# Patient Record
Sex: Male | Born: 2000 | Hispanic: No | Marital: Single | Smoking: Never smoker
Health system: Southern US, Community
[De-identification: ages and names within clinical notes are randomized; demographics above are authoritative.]

## PROBLEM LIST (undated history)

## (undated) DIAGNOSIS — G589 Mononeuropathy, unspecified: Secondary | ICD-10-CM

## (undated) DIAGNOSIS — I10 Essential (primary) hypertension: Secondary | ICD-10-CM

## (undated) DIAGNOSIS — Z8781 Personal history of (healed) traumatic fracture: Secondary | ICD-10-CM

## (undated) DIAGNOSIS — Z9889 Other specified postprocedural states: Secondary | ICD-10-CM

## (undated) HISTORY — DX: Essential (primary) hypertension: I10

---
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
## 2018-05-25 ENCOUNTER — Other Ambulatory Visit: Payer: Self-pay

## 2018-05-25 ENCOUNTER — Encounter (HOSPITAL_COMMUNITY): Payer: Self-pay

## 2018-05-25 ENCOUNTER — Emergency Department (HOSPITAL_COMMUNITY)
Admission: EM | Admit: 2018-05-25 | Discharge: 2018-05-25 | Disposition: A | Discharge status: Home / Self Care | Payer: Medicaid Other | Attending: Emergency Medicine | Admitting: Emergency Medicine

## 2018-05-25 DIAGNOSIS — G500 Trigeminal neuralgia: Secondary | ICD-10-CM | POA: Insufficient documentation

## 2018-05-25 DIAGNOSIS — G509 Disorder of trigeminal nerve, unspecified: Secondary | ICD-10-CM

## 2018-05-25 DIAGNOSIS — R200 Anesthesia of skin: Secondary | ICD-10-CM | POA: Diagnosis present

## 2018-05-25 MED ORDER — PREDNISONE 20 MG PO TABS: 40.0000 mg | ORAL_TABLET | Freq: Every day | ORAL | 0 refills | Status: DC

## 2018-05-25 NOTE — ED Notes (Signed)
ED Provider at bedside. 

## 2018-05-25 NOTE — ED Provider Notes (Signed)
[facility] DEPT Provider Note   CSN: [id number] Arrival date & time: 05/25/18  1530     History   Chief Complaint Chief Complaint  Patient presents with  . Numbness    HPI Glen Sullivan is a 17 y.o. male.  HPI   Glen Sullivan with R facial numbness. Intermittent over past 2 days. Feels around r eye, R cheek to about level of jawline. No weakness. No change in speech. No acute visual changes. No confusion. No rash. No pain.   History reviewed. No pertinent past medical history.  There are no active problems to display for this patient.   History reviewed. No pertinent surgical history.      Home Medications    Prior to Admission medications   Medication Sig Start Date End Date Taking? Authorizing Provider  predniSONE (DELTASONE) 20 MG tablet Take 2 tablets (40 mg total) by mouth daily. 05/25/18   Virgel Manifold, MD    Family History Family History  Problem Relation Age of Onset  . Diabetes Father   . Diabetes Other   . Hypertension Other     Social History Social History   Tobacco Use  . Smoking status: Never Smoker  Substance Use Topics  . Alcohol use: No  . Drug use: No     Allergies   Patient has no known allergies.   Review of Systems Review of Systems  All systems reviewed and negative, other than as noted in HPI.  Physical Exam Updated Vital Signs BP (!) 165/89 (BP Location: Left Arm)   Pulse 71   Temp 98.3 F (36.8 C) (Oral)   Resp 17   SpO2 100%   Physical Exam  Constitutional: He is oriented to person, place, and time. He appears well-developed and well-nourished. No distress.  HENT:  Head: Normocephalic and atraumatic.  Right Ear: External ear normal.  Left Ear: External ear normal.  Eyes: Pupils are equal, round, and reactive to light. Conjunctivae and EOM are normal. Right eye exhibits no discharge. Left eye exhibits no discharge.  Neck: Neck supple.  Cardiovascular: Normal rate, regular  rhythm and normal heart sounds. Exam reveals no gallop and no friction rub.  No murmur heard. Pulmonary/Chest: Effort normal and breath sounds normal. No respiratory distress.  Abdominal: Soft. He exhibits no distension. There is no tenderness.  Musculoskeletal: He exhibits no edema or tenderness.  Neurological: He is alert and oriented to person, place, and time.  Speech clear. Decreased sensation light tough r v2 distribution. CN, sensation otherwise intact. Strength 5/5 b/l u/l ext. Good finger to nose b/l. Steady gait.   Skin: Skin is warm and dry.  Psychiatric: He has a normal mood and affect. His behavior is normal. Thought content normal.  Nursing note and vitals reviewed.    ED Treatments / Results  Labs (all labs ordered are listed, but only abnormal results are displayed) Labs Reviewed - No data to display  EKG None  Radiology No results found.  Procedures Procedures (including critical care time)  Medications Ordered in ED Medications - No data to display   Initial Impression / Assessment and Plan / ED Course  I have reviewed the triage vital signs and the nursing notes.  Pertinent labs & imaging results that were available during my care of the patient were reviewed by me and considered in my medical decision making (see chart for details).      I have reviewed the triage vital signs and the nursing notes. Prior records were  reviewed for additional information.    Pertinent labs & imaging results that were available during my care of the patient were reviewed by me and considered in my medical decision making (see chart for details).   Glen Sullivan with intermittent numbness in R V2 distribution. No pain. No rash. No motor findings. He has no known systemic diseases. His neuro exam is non-focal aside from decreased sensation to light tough on R face. HTN noted, but he says he has been told this previously. Advised to establish with PCP and address this further. My  suspicion for CVA or space occupying lesion at this point is pretty low. If it was myself, I'd take a "wait and see" approach. If symptoms persist he may need imaging or other additional testing. I detailed return precautions with him and his mother. Outpt FU with neurology otherwise for persistent symptoms.   Final Clinical Impressions(s) / ED Diagnoses   Final diagnoses:  Trigeminal sensory loss    ED Discharge Orders         Ordered    predniSONE (DELTASONE) 20 MG tablet  Daily     05/25/18 1635           Virgel Manifold, MD 06/01/18 1108

## 2018-05-25 NOTE — Discharge Instructions (Addendum)
You are having numbness in a branch (V2) of a nerve called your trigeminal nerve. At this point, I suspect a benign cause. If this is something that persists beyond a few days, I want you to be evaluated by a neurologist. If you develop other symptoms such as weakness, changes in speech, vision problems, etc then I want you evaluated in the ER immediately.

## 2018-05-25 NOTE — ED Triage Notes (Signed)
Pt reports numbness bilaterally in both cheeks and chin. He states that it started last night, but improved and now it is back. He also reports some R sided otalgia last night that has improved, but worsens with cold. A&Ox4. Ambulatory. No facial droop or unilateral weakness.

## 2018-05-28 ENCOUNTER — Encounter (HOSPITAL_COMMUNITY): Payer: Self-pay

## 2018-05-28 ENCOUNTER — Other Ambulatory Visit: Payer: Self-pay

## 2018-05-28 DIAGNOSIS — R51 Headache: Secondary | ICD-10-CM | POA: Diagnosis present

## 2018-05-28 DIAGNOSIS — Z5321 Procedure and treatment not carried out due to patient leaving prior to being seen by health care provider: Secondary | ICD-10-CM | POA: Diagnosis not present

## 2018-05-28 NOTE — ED Triage Notes (Signed)
Pt presents to ED from home for HA and L arm numbness. Pt reports that he was seen here recently for facial numbness and told to return if symptoms worsened. Pt reports that the numbness and headache were worse earlier. Symptoms come and go.

## 2018-05-29 ENCOUNTER — Encounter (HOSPITAL_COMMUNITY): Payer: Self-pay

## 2018-05-29 ENCOUNTER — Other Ambulatory Visit: Payer: Self-pay

## 2018-05-29 ENCOUNTER — Emergency Department (HOSPITAL_COMMUNITY)
Admission: EM | Admit: 2018-05-29 | Discharge: 2018-05-29 | Disposition: A | Discharge status: Home / Self Care | Payer: Medicaid Other | Attending: Emergency Medicine | Admitting: Emergency Medicine

## 2018-05-29 ENCOUNTER — Emergency Department (HOSPITAL_COMMUNITY)
Admission: EM | Admit: 2018-05-29 | Discharge: 2018-05-29 | Disposition: A | Discharge status: Home / Self Care | Payer: Medicaid Other | Source: Home / Self Care | Attending: Emergency Medicine | Admitting: Emergency Medicine

## 2018-05-29 DIAGNOSIS — R202 Paresthesia of skin: Secondary | ICD-10-CM | POA: Insufficient documentation

## 2018-05-29 NOTE — ED Notes (Signed)
Patient called for rooming but did not answer.

## 2018-05-29 NOTE — ED Notes (Signed)
Patient did not come when called from lobby x2.

## 2018-05-29 NOTE — ED Notes (Signed)
Patient did not come when called from lobby x3. Triage RN aware.

## 2018-05-29 NOTE — ED Triage Notes (Signed)
Patient reports that he was seen 4 days ago for numbness of the face. patient states he was told to come back if any new numbness occurs. Patient states he woke last night with numbness of the left arm. Patient states the numbness has occurred in his left leg as well,but not now. Patient states the numbness is always worse at night.

## 2018-05-29 NOTE — ED Provider Notes (Signed)
[facility] DEPT Provider Note   CSN: [id number] Arrival date & time: 05/29/18  1121     History   Chief Complaint Chief Complaint  Patient presents with  . Numbness    HPI Glen Sullivan is a 17 y.o. male.  17yo male presents with complaints of numbness, described as altered sensation involving the left side forehead, left upper arm and left thigh currently. Patient was seen in the ER 5 days ago for same advised to follow up with neurology and is scheduled to be seen 06/12/18. Patient states for the past few days he experiences altered sensation mostly at night, sometimes involving both feet and hands, sometimes in the right side of his body, mostly on the left. States the feeling wakes him from his sleep, is able to move his arms and legs equally, denies a pins and needles type of feeling. Denies difficulty with speech, changes in vision, changes in gait or weakness, dizziness, headaches.      History reviewed. No pertinent past medical history.  There are no active problems to display for this patient.   History reviewed. No pertinent surgical history.      Home Medications    Prior to Admission medications   Medication Sig Start Date End Date Taking? Authorizing Provider  predniSONE (DELTASONE) 20 MG tablet Take 2 tablets (40 mg total) by mouth daily. 05/25/18  Yes Virgel Manifold, MD    Family History Family History  Problem Relation Age of Onset  . Diabetes Father   . Diabetes Other   . Hypertension Other     Social History Social History   Tobacco Use  . Smoking status: Never Smoker  . Smokeless tobacco: Never Used  Substance Use Topics  . Alcohol use: No  . Drug use: No     Allergies   Patient has no known allergies.   Review of Systems Review of Systems  Constitutional: Negative for activity change, appetite change, chills and fever.  Eyes: Negative for pain and visual disturbance.  Gastrointestinal:  Negative for nausea and vomiting.  Musculoskeletal: Negative for arthralgias, joint swelling and myalgias.  Skin: Negative for color change, rash and wound.  Allergic/Immunologic: Negative for immunocompromised state.  Neurological: Positive for numbness. Negative for dizziness, facial asymmetry, speech difficulty, weakness, light-headedness and headaches.  Hematological: Negative for adenopathy.  Psychiatric/Behavioral: Negative for confusion.  All other systems reviewed and are negative.    Physical Exam Updated Vital Signs BP (!) 152/80   Pulse 84   Temp 98.3 F (36.8 C)   Resp 16   Ht 5\' 9"  (1.753 m)   Wt 104.3 kg   SpO2 100%   BMI 33.97 kg/m   Physical Exam  Constitutional: He is oriented to person, place, and time. He appears well-developed and well-nourished. No distress.  HENT:  Head: Normocephalic and atraumatic.  Mouth/Throat: Oropharynx is clear and moist. No oropharyngeal exudate.  Eyes: Pupils are equal, round, and reactive to light. Conjunctivae and EOM are normal.  Neck: Normal range of motion. Neck supple.  Cardiovascular: Normal rate, regular rhythm, normal heart sounds and intact distal pulses.  No murmur heard. Pulmonary/Chest: Effort normal and breath sounds normal. No respiratory distress.  Musculoskeletal: He exhibits no tenderness or deformity.  Neurological: He is alert and oriented to person, place, and time. He has normal strength and normal reflexes. He is not disoriented. He displays normal reflexes. A sensory deficit is present. No cranial nerve deficit. He exhibits normal muscle tone. He displays a  negative Romberg sign. Coordination and gait normal. GCS eye subscore is 4. GCS verbal subscore is 5. GCS motor subscore is 6.  Negative pronator drift, normal heel/shin and finger to nose. Reports altered sensation to left forehead, left upper arm laterally.  Skin: Skin is warm and dry. He is not diaphoretic.  Psychiatric: He has a normal mood and  affect. His behavior is normal.  Nursing note and vitals reviewed.    ED Treatments / Results  Labs (all labs ordered are listed, but only abnormal results are displayed) Labs Reviewed - No data to display  EKG None  Radiology No results found.  Procedures Procedures (including critical care time)  Medications Ordered in ED Medications - No data to display   Initial Impression / Assessment and Plan / ED Course  I have reviewed the triage vital signs and the nursing notes.  Pertinent labs & imaging results that were available during my care of the patient were reviewed by me and considered in my medical decision making (see chart for details).  Clinical Course as of May 30 1339  Thu May 29, 2018  1337 17yo male returns to ER with ongoing complaint of altered sensation. Patient was seen in the ER 5 days ago for altered sensation to the face, states now has symptoms in the legs and arms. Symptoms are worse at night, occasionally effecting the right arm and leg, more often effecting the left arm and leg. Denies any changes in speech, gait, vision. Exam today is unremarkable with the the exception of altered light touch sensation to the anterior left upper arm and left forehead. Patient is scheduled to see neurology 06/12/18, advised to keep this appointment, return to ER for worsening or concerning symptoms. History is not consistent with CVA or other emergent condition.   [LM]    Clinical Course User Index [LM] Tacy Learn, PA-C    Final Clinical Impressions(s) / ED Diagnoses   Final diagnoses:  Paresthesia    ED Discharge Orders    None       Roque Lias 05/29/18 1340    Lacretia Leigh, MD 05/30/18 1710

## 2018-05-29 NOTE — Discharge Instructions (Addendum)
Return to ER for worsening or concerning symptoms, otherwise follow up with neurology as scheduled.

## 2018-06-12 ENCOUNTER — Encounter: Payer: Self-pay | Admitting: Neurology

## 2018-06-12 ENCOUNTER — Telehealth: Payer: Self-pay | Admitting: Neurology

## 2018-06-12 ENCOUNTER — Ambulatory Visit: Payer: Medicaid Other | Admitting: Neurology

## 2018-06-12 VITALS — BP 132/78 | HR 73 | Ht 70.0 in | Wt 234.0 lb

## 2018-06-12 DIAGNOSIS — R202 Paresthesia of skin: Secondary | ICD-10-CM | POA: Diagnosis not present

## 2018-06-12 NOTE — Telephone Encounter (Signed)
medicaid order sent to [facility]. They will obtain the auth and will reach out to the pt to schedule.

## 2018-06-12 NOTE — Patient Instructions (Signed)
I had a long discussion with the patient regarding his facial and left arm paresthesias discussed differential diagnosis, evaluation plan and answered questions. I recommend that we  check MRI scan of the brain with and without contrast and further evaluation will depend on the scan results. Since the paresthesias have resolved at the present time does not need specific medications but have advised him to call me if they recur. He will return for follow-up in the future only as needed.  Paresthesia Paresthesia is a burning or prickling feeling. This feeling can happen in any part of the body. It often happens in the hands, arms, legs, or feet. Usually, it is not painful. In most cases, the feeling goes away in a short time and is not a sign of a serious problem. Follow these instructions at home:  Avoid drinking alcohol.  Try massage or needle therapy (acupuncture) to help with your problems.  Keep all follow-up visits as told by your doctor. This is important. Contact a doctor if:  You keep on having episodes of paresthesia.  Your burning or prickling feeling gets worse when you walk.  You have pain or cramps.  You feel dizzy.  You have a rash. Get help right away if:  You feel weak.  You have trouble walking or moving.  You have problems speaking, understanding, or seeing.  You feel confused.  You cannot control when you pee (urinate) or poop (bowel movement).  You lose feeling (numbness) after an injury.  You pass out (faint). This information is not intended to replace advice given to you by your health care provider. Make sure you discuss any questions you have with your health care provider. Document Released: 08/30/2008 Document Revised: 02/23/2016 Document Reviewed: 09/13/2014 Elsevier Interactive Patient Education  [employer].

## 2018-06-12 NOTE — Progress Notes (Signed)
[facility] [address] [place]. [place] [address] [phone]       OFFICE CONSULT NOTE  [name] Date of Birth:  2000-10-06 Medical Record Number:  [id number]   Referring MD:  Suella Broad, PA-c Reason for Referral:  numbness HPI:  17 year old Hispanic male seen today for initial office consultation visit follow-up numbness. History is obtained from the patient, review of electronic medical records. Patient states that about 2 weeks ago he woke up in sleep with numbness on his face. He describes this as the face feeling different compared to the rest of the body. He denies any tingling burning or pain sensation. He had no headache at that time. He went back to sleep. When he woke up the next day the numbness on the face was back. As the day went on he also noticed some numbness in the left arm. This lasted most of the day. The day after that he noticed that the numbness gradually went away. He states he did have a mild headache at that time but that it was not severe or incapacitating. Denied any accompanying nausea, blurred vision or visual symptoms. He does not have any prior history of migraine headaches, seizures, significant neurological problems. He denies any other episodes of blurred vision, loss of vision, vertigo, diplopia, gait or balance problems. Patient was seen in the ER and given prednisone taper pack which she has finished. Denies any history of allergies with recurrent sinus infections. Denies drinking drugs and smoking cigarettes or drinking alcohol.there is no history of strokes or heart attacks at a young age. There is no history of deep and thrombosis pulmonary embolism.  ROS:   14 system review of systems is positive for numbness only all other systems negative  PMH: History reviewed. No pertinent past medical history.  Social History:  Social History   Socioeconomic History  . Marital status: Single    Spouse name: Not on file    . Number of children: Not on file  . Years of education: Not on file  . Highest education level: Not on file  Occupational History  . Not on file  Social Needs  . Financial resource strain: Not on file  . Food insecurity:    Worry: Not on file    Inability: Not on file  . Transportation needs:    Medical: Not on file    Non-medical: Not on file  Tobacco Use  . Smoking status: Never Smoker  . Smokeless tobacco: Never Used  Substance and Sexual Activity  . Alcohol use: No  . Drug use: No  . Sexual activity: Not on file  Lifestyle  . Physical activity:    Days per week: Not on file    Minutes per session: Not on file  . Stress: Not on file  Relationships  . Social connections:    Talks on phone: Not on file    Gets together: Not on file    Attends religious service: Not on file    Active member of club or organization: Not on file    Attends meetings of clubs or organizations: Not on file    Relationship status: Not on file  . Intimate partner violence:    Fear of current or ex partner: Not on file    Emotionally abused: Not on file    Physically abused: Not on file    Forced sexual activity: Not on file  Other Topics Concern  . Not on file  Social History  Narrative  . Not on file    Medications:   No current outpatient medications on file prior to visit.   No current facility-administered medications on file prior to visit.     Allergies:  No Known Allergies  Physical Exam General: well developed, well nourished, young Hispanic male seated, in no evident distress Head: head normocephalic and atraumatic.   Neck: supple with no carotid or supraclavicular bruits Cardiovascular: regular rate and rhythm, no murmurs Musculoskeletal: no deformity Skin:  no rash/petichiae Vascular:  Normal pulses all extremities  Neurologic Exam Mental Status: Awake and fully alert. Oriented to place and time. Recent and remote memory intact. Attention span, concentration and  fund of knowledge appropriate. Mood and affect appropriate.  Cranial Nerves: Fundoscopic exam reveals sharp disc margins. Pupils equal, briskly reactive to light. Extraocular movements full without nystagmus. Visual fields full to confrontation. Hearing intact. Facial sensation intact. Face, tongue, palate moves normally and symmetrically.  Motor: Normal bulk and tone. Normal strength in all tested extremity muscles. Sensory.: intact to touch , pinprick , position and vibratory sensation.  Coordination: Rapid alternating movements normal in all extremities. Finger-to-nose and heel-to-shin performed accurately bilaterally. Gait and Station: Arises from chair without difficulty. Stance is normal. Gait demonstrates normal stride length and balance . Able to heel, toe and tandem walk without difficulty.  Reflexes: 1+ and symmetric. Toes downgoing.       ASSESSMENT: 17 year old Hispanic male with transient face and left arm paresthesias of unclear etiology which appear to have resolved.     PLAN: I had a long discussion with the patient regarding his facial and left arm paresthesias discussed differential diagnosis, evaluation plan and answered questions. I recommend that we  check MRI scan of the brain with and without contrast and further evaluation will depend on the scan results. Since the paresthesias have resolved at the present time does not need specific medications but have advised him to call me if they recur. Greater than 50% time during this 45 mine consultation visit was spent on counseling and coordination of care about his face and arm paresthesias and answering questions.He will return for follow-up in the future only as needed.  Antony Contras, MD  [facility] [address] [place], [place] [address]  Phone [phone] Fax [phone] Note: This document was prepared with digital dictation and possible smart phrase technology. Any transcriptional  errors that result from this process are unintentional.

## 2018-06-26 ENCOUNTER — Other Ambulatory Visit: Payer: Self-pay | Admitting: Neurology

## 2018-06-26 ENCOUNTER — Ambulatory Visit
Admission: RE | Admit: 2018-06-26 | Discharge: 2018-06-26 | Disposition: A | Discharge status: Home / Self Care | Payer: Medicaid Other | Source: Ambulatory Visit | Attending: Neurology | Admitting: Neurology

## 2018-06-26 DIAGNOSIS — Z77018 Contact with and (suspected) exposure to other hazardous metals: Secondary | ICD-10-CM

## 2018-06-26 DIAGNOSIS — R202 Paresthesia of skin: Secondary | ICD-10-CM

## 2018-06-26 MED ORDER — GADOBENATE DIMEGLUMINE 529 MG/ML IV SOLN
20.0000 mL | Freq: Once | INTRAVENOUS | Status: AC | PRN
  Administered 2018-06-26: 20 mL via INTRAVENOUS

## 2018-06-27 ENCOUNTER — Telehealth: Payer: Self-pay | Admitting: *Deleted

## 2018-06-27 NOTE — Telephone Encounter (Signed)
Spoke with patient and informed him that his brain MRI scan was normal. He had no questions, verbalized understanding, appreciation.

## 2019-07-03 IMAGING — CR DG ORBITS FOR FOREIGN BODY
2 series · 2 of 2 positions shown · non-contrast
Comparison: None.

CLINICAL DATA: Metal working/exposure; clearance prior to MRI

EXAM:
ORBITS FOR FOREIGN BODY - 2 VIEW

[w orbit pa (1 of 2)]
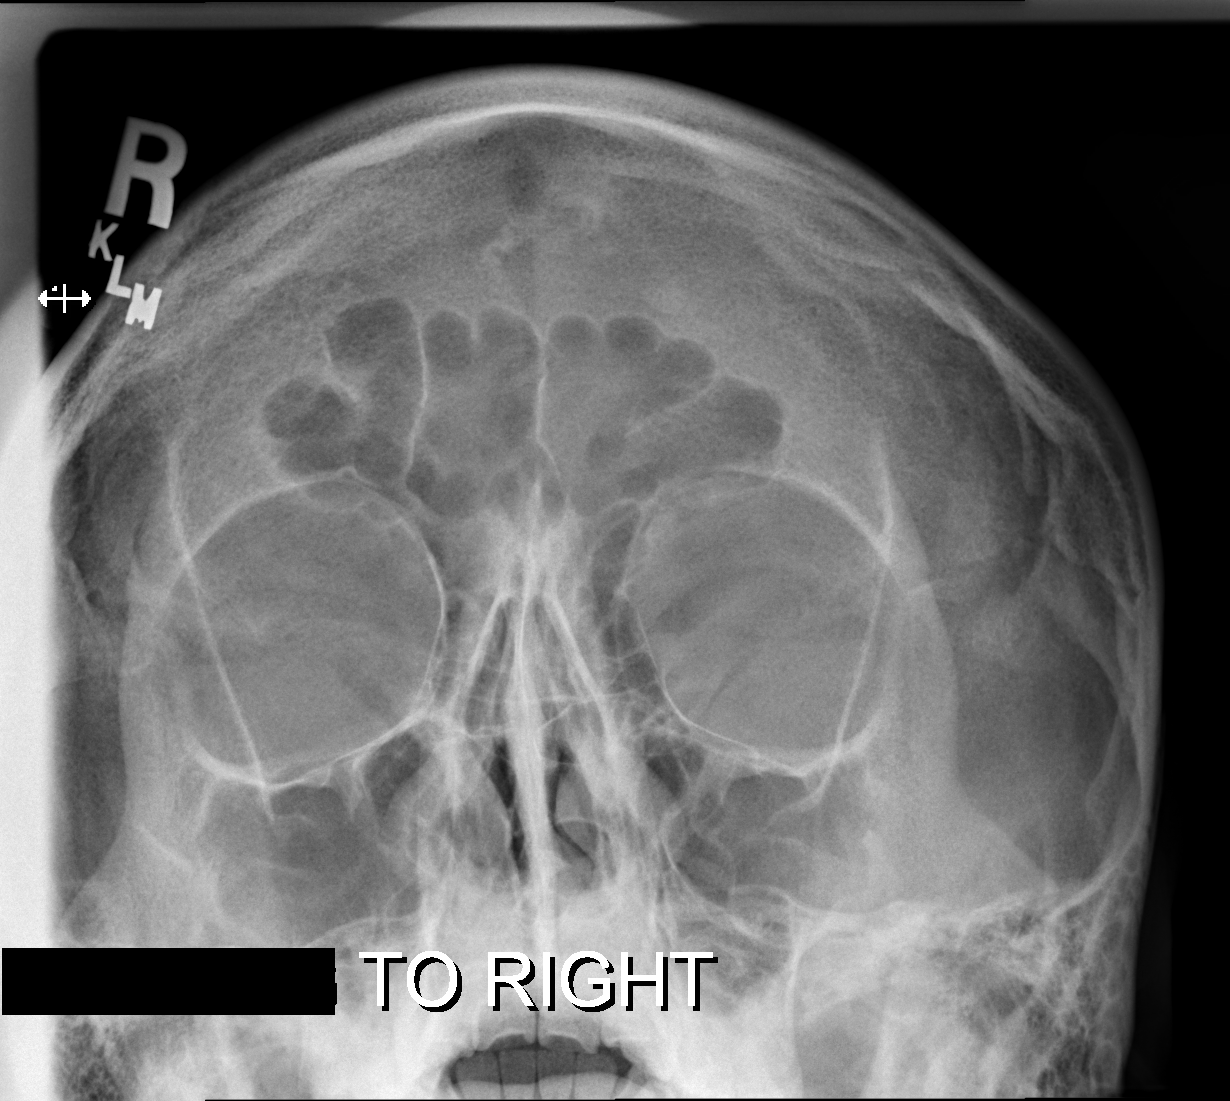

[w orbit pa (2 of 2)]
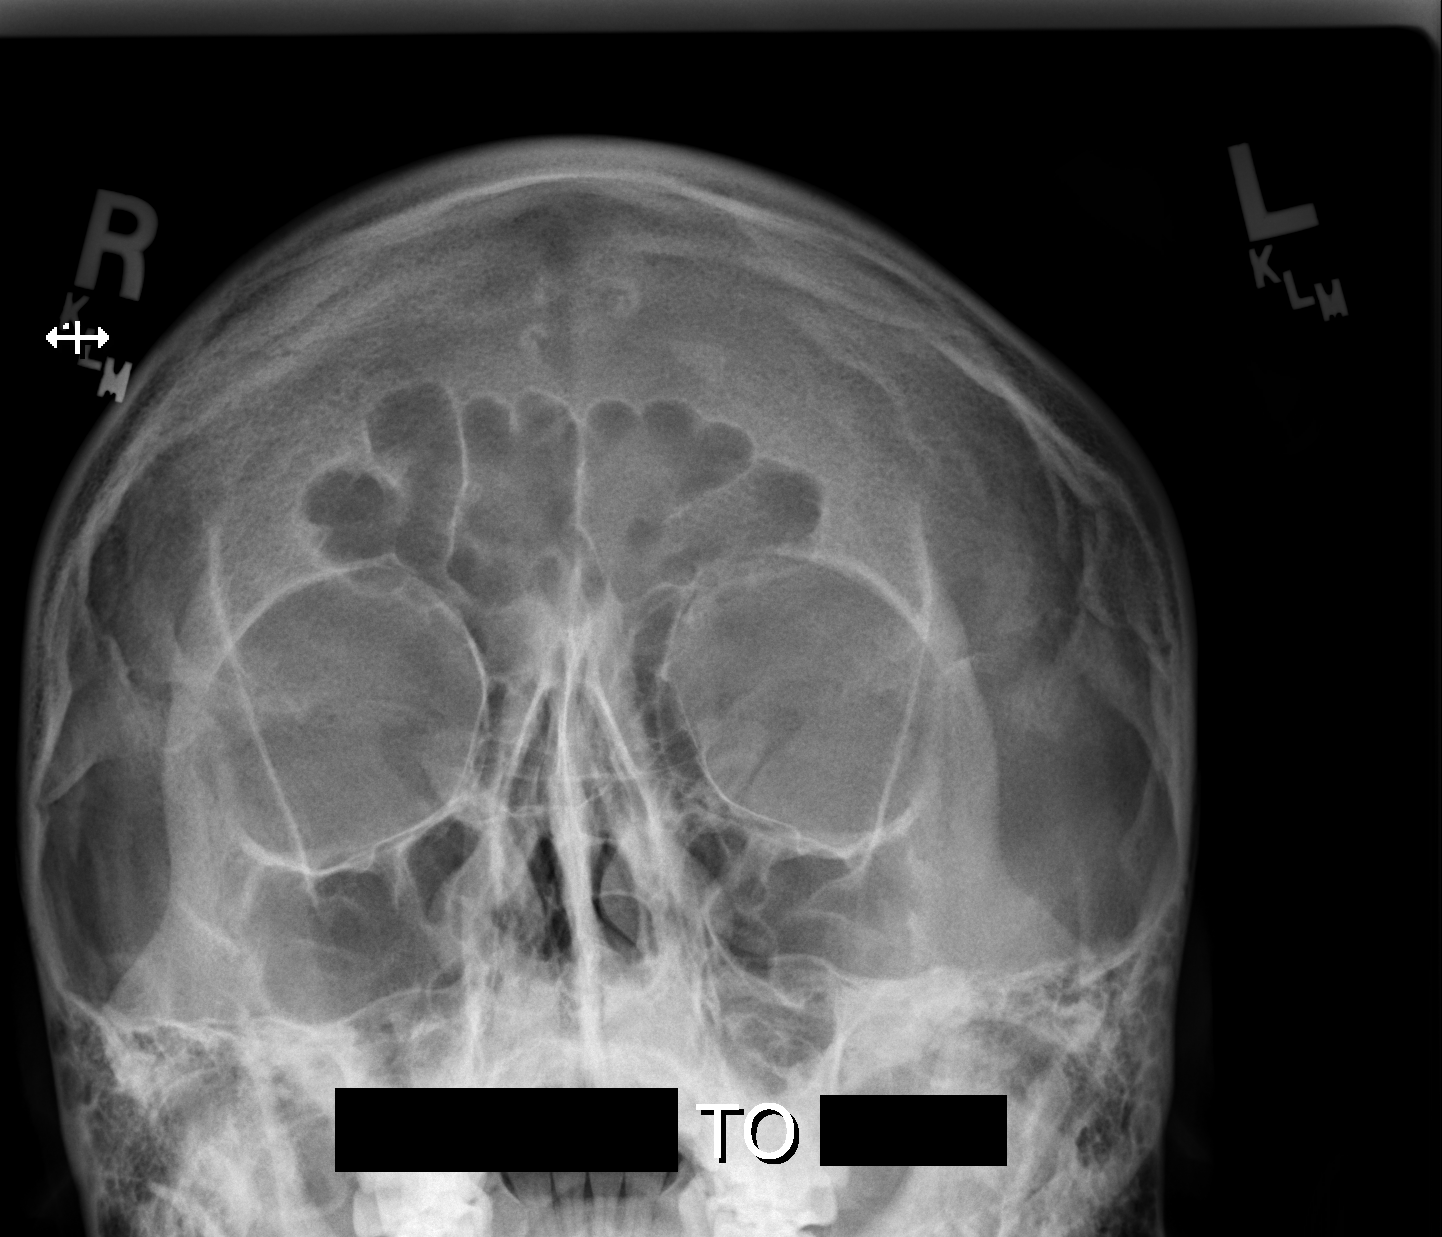

[2 of 2 positions shown; findings below may reference images not displayed]

FINDINGS: There is no evidence of metallic foreign body within the orbits. No
significant bone abnormality identified.
IMPRESSION: No evidence of metallic foreign body within the orbits.

## 2022-06-25 ENCOUNTER — Observation Stay (HOSPITAL_COMMUNITY)
Admission: EM | Admit: 2022-06-25 | Discharge: 2022-06-26 | Disposition: A | Discharge status: Home / Self Care | Payer: BLUE CROSS/BLUE SHIELD | Attending: Emergency Medicine | Admitting: Emergency Medicine

## 2022-06-25 ENCOUNTER — Encounter (HOSPITAL_COMMUNITY): Payer: Self-pay | Admitting: Emergency Medicine

## 2022-06-25 ENCOUNTER — Emergency Department (HOSPITAL_COMMUNITY): Payer: BLUE CROSS/BLUE SHIELD | Admitting: Anesthesiology

## 2022-06-25 ENCOUNTER — Emergency Department (HOSPITAL_COMMUNITY): Payer: BLUE CROSS/BLUE SHIELD

## 2022-06-25 ENCOUNTER — Encounter (HOSPITAL_COMMUNITY)
Admission: EM | Disposition: A | Discharge status: Home / Self Care | Payer: Self-pay | Source: Home / Self Care | Attending: Emergency Medicine

## 2022-06-25 DIAGNOSIS — Z9049 Acquired absence of other specified parts of digestive tract: Secondary | ICD-10-CM

## 2022-06-25 DIAGNOSIS — K3532 Acute appendicitis with perforation and localized peritonitis, without abscess: Secondary | ICD-10-CM | POA: Diagnosis not present

## 2022-06-25 DIAGNOSIS — R1031 Right lower quadrant pain: Secondary | ICD-10-CM | POA: Diagnosis present

## 2022-06-25 DIAGNOSIS — K3580 Unspecified acute appendicitis: Principal | ICD-10-CM

## 2022-06-25 HISTORY — DX: Mononeuropathy, unspecified: G58.9

## 2022-06-25 HISTORY — DX: Personal history of (healed) traumatic fracture: Z87.81

## 2022-06-25 HISTORY — PX: LAPAROSCOPIC APPENDECTOMY: SHX408

## 2022-06-25 HISTORY — DX: Other specified postprocedural states: Z98.890

## 2022-06-25 LAB — COMPREHENSIVE METABOLIC PANEL
ALT: 14 U/L (ref 0–44)
AST: 15 U/L (ref 15–41)
Albumin: 4.8 g/dL (ref 3.5–5.0)
Alkaline Phosphatase: 61 U/L (ref 38–126)
Anion gap: 10 (ref 5–15)
BUN: 10 mg/dL (ref 6–20)
CO2: 22 mmol/L (ref 22–32)
Calcium: 9.6 mg/dL (ref 8.9–10.3)
Chloride: 103 mmol/L (ref 98–111)
Creatinine, Ser: 0.87 mg/dL (ref 0.61–1.24)
GFR, Estimated: >60 mL/min (ref >60)
Glucose, Bld: 109 mg/dL — ABNORMAL HIGH (ref 70–99)
Potassium: 3.6 mmol/L (ref 3.5–5.1)
Sodium: 135 mmol/L (ref 135–145)
Total Bilirubin: 1 mg/dL (ref 0.3–1.2)
Total Protein: 7.9 g/dL (ref 6.5–8.1)

## 2022-06-25 LAB — CBC
HCT: 42.4 % (ref 39.0–52.0)
Hemoglobin: 14.9 g/dL (ref 13.0–17.0)
MCH: 30.8 pg (ref 26.0–34.0)
MCHC: 35.1 g/dL (ref 30.0–36.0)
MCV: 87.6 fL (ref 80.0–100.0)
Platelets: 196 10*3/uL (ref 150–400)
RBC: 4.84 MIL/uL (ref 4.22–5.81)
RDW: 12.1 % (ref 11.5–15.5)
WBC: 12.6 10*3/uL — ABNORMAL HIGH (ref 4.0–10.5)
nRBC: 0 % (ref 0.0–0.2)

## 2022-06-25 LAB — URINALYSIS, ROUTINE W REFLEX MICROSCOPIC
Bacteria, UA: NONE SEEN
Bilirubin Urine: NEGATIVE
Glucose, UA: NEGATIVE mg/dL
Hgb urine dipstick: NEGATIVE
Ketones, ur: 20 mg/dL — AB
Leukocytes,Ua: NEGATIVE
Nitrite: NEGATIVE
Protein, ur: 100 mg/dL — AB
Specific Gravity, Urine: 1.01 (ref 1.005–1.030)
pH: 7 (ref 5.0–8.0)

## 2022-06-25 LAB — LIPASE, BLOOD: Lipase: 28 U/L (ref 11–51)

## 2022-06-25 SURGERY — APPENDECTOMY, LAPAROSCOPIC
Anesthesia: General

## 2022-06-25 MED ORDER — SENNA 8.6 MG PO TABS
1.0000 | ORAL_TABLET | Freq: Two times a day (BID) | ORAL | Status: DC
  Administered 2022-06-25 – 2022-06-26 (×2): 8.6 mg via ORAL
  Filled 2022-06-25 (×2): qty 1

## 2022-06-25 MED ORDER — DEXAMETHASONE SODIUM PHOSPHATE 10 MG/ML IJ SOLN
INTRAMUSCULAR | Status: AC
  Filled 2022-06-25: qty 1

## 2022-06-25 MED ORDER — CHLORHEXIDINE GLUCONATE 0.12 % MT SOLN
15.0000 mL | Freq: Once | OROMUCOSAL | Status: AC
  Administered 2022-06-25: 15 mL via OROMUCOSAL

## 2022-06-25 MED ORDER — DIPHENHYDRAMINE HCL 12.5 MG/5ML PO ELIX: 12.5000 mg | ORAL_SOLUTION | Freq: Four times a day (QID) | ORAL | Status: DC | PRN

## 2022-06-25 MED ORDER — ROCURONIUM BROMIDE 10 MG/ML (PF) SYRINGE
PREFILLED_SYRINGE | INTRAVENOUS | Status: AC
  Filled 2022-06-25: qty 10

## 2022-06-25 MED ORDER — HYDROMORPHONE HCL 1 MG/ML IJ SOLN
0.2500 mg | INTRAMUSCULAR | Status: DC | PRN
  Administered 2022-06-25: 0.5 mg via INTRAVENOUS

## 2022-06-25 MED ORDER — OXYCODONE HCL 5 MG PO TABS
ORAL_TABLET | ORAL | Status: AC
  Filled 2022-06-25: qty 1

## 2022-06-25 MED ORDER — MELATONIN 3 MG PO TABS: 3.0000 mg | ORAL_TABLET | Freq: Every evening | ORAL | Status: DC | PRN

## 2022-06-25 MED ORDER — CEFTRIAXONE SODIUM 2 G IJ SOLR
2.0000 g | Freq: Once | INTRAMUSCULAR | Status: AC
  Administered 2022-06-25: 2 g via INTRAVENOUS
  Filled 2022-06-25: qty 20

## 2022-06-25 MED ORDER — HYDROMORPHONE HCL 1 MG/ML IJ SOLN
INTRAMUSCULAR | Status: AC
  Filled 2022-06-25: qty 1

## 2022-06-25 MED ORDER — IOHEXOL 300 MG/ML  SOLN
100.0000 mL | Freq: Once | INTRAMUSCULAR | Status: AC | PRN
  Administered 2022-06-25: 100 mL via INTRAVENOUS

## 2022-06-25 MED ORDER — ROCURONIUM BROMIDE 10 MG/ML (PF) SYRINGE
PREFILLED_SYRINGE | INTRAVENOUS | Status: DC | PRN
  Administered 2022-06-25: 50 mg via INTRAVENOUS
  Administered 2022-06-25: 10 mg via INTRAVENOUS

## 2022-06-25 MED ORDER — KETOROLAC TROMETHAMINE 30 MG/ML IJ SOLN: 30.0000 mg | Freq: Four times a day (QID) | INTRAMUSCULAR | Status: DC | PRN

## 2022-06-25 MED ORDER — KCL IN DEXTROSE-NACL 20-5-0.45 MEQ/L-%-% IV SOLN
INTRAVENOUS | Status: AC
  Filled 2022-06-25: qty 1000

## 2022-06-25 MED ORDER — SUGAMMADEX SODIUM 200 MG/2ML IV SOLN
INTRAVENOUS | Status: DC | PRN
  Administered 2022-06-25: 200 mg via INTRAVENOUS

## 2022-06-25 MED ORDER — PROPOFOL 10 MG/ML IV BOLUS
INTRAVENOUS | Status: DC | PRN
  Administered 2022-06-25: 200 mg via INTRAVENOUS

## 2022-06-25 MED ORDER — PROMETHAZINE HCL 25 MG/ML IJ SOLN: 6.2500 mg | INTRAMUSCULAR | Status: DC | PRN

## 2022-06-25 MED ORDER — ONDANSETRON 4 MG PO TBDP: 4.0000 mg | ORAL_TABLET | Freq: Four times a day (QID) | ORAL | Status: DC | PRN

## 2022-06-25 MED ORDER — MIDAZOLAM HCL 2 MG/2ML IJ SOLN
INTRAMUSCULAR | Status: AC
  Filled 2022-06-25: qty 2

## 2022-06-25 MED ORDER — ACETAMINOPHEN 650 MG RE SUPP: 650.0000 mg | Freq: Four times a day (QID) | RECTAL | Status: DC | PRN

## 2022-06-25 MED ORDER — OXYCODONE HCL 5 MG PO TABS: 5.0000 mg | ORAL_TABLET | Freq: Once | ORAL | Status: DC | PRN

## 2022-06-25 MED ORDER — OXYCODONE HCL 5 MG PO TABS: 5.0000 mg | ORAL_TABLET | ORAL | Status: DC | PRN

## 2022-06-25 MED ORDER — SODIUM CHLORIDE 0.9 % IV SOLN: 2.0000 g | INTRAVENOUS | Status: DC

## 2022-06-25 MED ORDER — SODIUM CHLORIDE (PF) 0.9 % IJ SOLN
INTRAMUSCULAR | Status: AC
  Filled 2022-06-25: qty 50

## 2022-06-25 MED ORDER — LIDOCAINE 2% (20 MG/ML) 5 ML SYRINGE
INTRAMUSCULAR | Status: DC | PRN
  Administered 2022-06-25: 80 mg via INTRAVENOUS

## 2022-06-25 MED ORDER — PROCHLORPERAZINE EDISYLATE 10 MG/2ML IJ SOLN: 5.0000 mg | Freq: Four times a day (QID) | INTRAMUSCULAR | Status: DC | PRN

## 2022-06-25 MED ORDER — KETOROLAC TROMETHAMINE 30 MG/ML IJ SOLN
INTRAMUSCULAR | Status: AC
  Filled 2022-06-25: qty 1

## 2022-06-25 MED ORDER — PROPOFOL 10 MG/ML IV BOLUS
INTRAVENOUS | Status: AC
  Filled 2022-06-25: qty 20

## 2022-06-25 MED ORDER — LIDOCAINE HCL 1 % IJ SOLN
INTRAMUSCULAR | Status: AC
  Filled 2022-06-25: qty 20

## 2022-06-25 MED ORDER — PROCHLORPERAZINE MALEATE 10 MG PO TABS: 10.0000 mg | ORAL_TABLET | Freq: Four times a day (QID) | ORAL | Status: DC | PRN

## 2022-06-25 MED ORDER — METRONIDAZOLE 500 MG/100ML IV SOLN
500.0000 mg | Freq: Once | INTRAVENOUS | Status: AC
  Administered 2022-06-25: 500 mg via INTRAVENOUS
  Filled 2022-06-25: qty 100

## 2022-06-25 MED ORDER — SUCCINYLCHOLINE CHLORIDE 200 MG/10ML IV SOSY
PREFILLED_SYRINGE | INTRAVENOUS | Status: DC | PRN
  Administered 2022-06-25: 140 mg via INTRAVENOUS

## 2022-06-25 MED ORDER — DEXAMETHASONE SODIUM PHOSPHATE 10 MG/ML IJ SOLN
INTRAMUSCULAR | Status: DC | PRN
  Administered 2022-06-25: 10 mg via INTRAVENOUS

## 2022-06-25 MED ORDER — FENTANYL CITRATE (PF) 100 MCG/2ML IJ SOLN
INTRAMUSCULAR | Status: DC | PRN
  Administered 2022-06-25: 100 ug via INTRAVENOUS

## 2022-06-25 MED ORDER — KETOROLAC TROMETHAMINE 30 MG/ML IJ SOLN
30.0000 mg | Freq: Four times a day (QID) | INTRAMUSCULAR | Status: AC
  Administered 2022-06-25 – 2022-06-26 (×3): 30 mg via INTRAVENOUS
  Filled 2022-06-25 (×2): qty 1

## 2022-06-25 MED ORDER — METRONIDAZOLE 500 MG/100ML IV SOLN
500.0000 mg | Freq: Two times a day (BID) | INTRAVENOUS | Status: DC
  Administered 2022-06-26: 500 mg via INTRAVENOUS
  Filled 2022-06-25: qty 100

## 2022-06-25 MED ORDER — MEPERIDINE HCL 50 MG/ML IJ SOLN: 6.2500 mg | INTRAMUSCULAR | Status: DC | PRN

## 2022-06-25 MED ORDER — BUPIVACAINE-EPINEPHRINE (PF) 0.25% -1:200000 IJ SOLN
INTRAMUSCULAR | Status: AC
  Filled 2022-06-25: qty 30

## 2022-06-25 MED ORDER — FENTANYL CITRATE (PF) 100 MCG/2ML IJ SOLN
INTRAMUSCULAR | Status: AC
  Filled 2022-06-25: qty 2

## 2022-06-25 MED ORDER — ONDANSETRON HCL 4 MG/2ML IJ SOLN
INTRAMUSCULAR | Status: AC
  Filled 2022-06-25: qty 2

## 2022-06-25 MED ORDER — ACETAMINOPHEN 325 MG PO TABS: 650.0000 mg | ORAL_TABLET | Freq: Four times a day (QID) | ORAL | Status: DC | PRN

## 2022-06-25 MED ORDER — LACTATED RINGERS IV SOLN: INTRAVENOUS | Status: DC

## 2022-06-25 MED ORDER — LACTATED RINGERS IR SOLN
Status: DC | PRN
  Administered 2022-06-25: 1

## 2022-06-25 MED ORDER — LIDOCAINE HCL (PF) 1 % IJ SOLN
INTRAMUSCULAR | Status: DC | PRN
  Administered 2022-06-25: 10 mL

## 2022-06-25 MED ORDER — OXYCODONE HCL 5 MG/5ML PO SOLN: 5.0000 mg | Freq: Once | ORAL | Status: DC | PRN

## 2022-06-25 MED ORDER — 0.9 % SODIUM CHLORIDE (POUR BTL) OPTIME
TOPICAL | Status: DC | PRN
  Administered 2022-06-25: 500 mL

## 2022-06-25 MED ORDER — DIPHENHYDRAMINE HCL 50 MG/ML IJ SOLN: 12.5000 mg | Freq: Four times a day (QID) | INTRAMUSCULAR | Status: DC | PRN

## 2022-06-25 MED ORDER — PHENYLEPHRINE 80 MCG/ML (10ML) SYRINGE FOR IV PUSH (FOR BLOOD PRESSURE SUPPORT)
PREFILLED_SYRINGE | INTRAVENOUS | Status: DC | PRN
  Administered 2022-06-25: 80 ug via INTRAVENOUS

## 2022-06-25 MED ORDER — SUCCINYLCHOLINE CHLORIDE 200 MG/10ML IV SOSY
PREFILLED_SYRINGE | INTRAVENOUS | Status: AC
  Filled 2022-06-25: qty 10

## 2022-06-25 MED ORDER — TRAMADOL HCL 50 MG PO TABS
50.0000 mg | ORAL_TABLET | Freq: Four times a day (QID) | ORAL | Status: DC | PRN
  Administered 2022-06-25 – 2022-06-26 (×2): 50 mg via ORAL
  Filled 2022-06-25 (×2): qty 1

## 2022-06-25 MED ORDER — ONDANSETRON HCL 4 MG/2ML IJ SOLN
INTRAMUSCULAR | Status: DC | PRN
  Administered 2022-06-25: 4 mg via INTRAVENOUS

## 2022-06-25 MED ORDER — MIDAZOLAM HCL 5 MG/5ML IJ SOLN
INTRAMUSCULAR | Status: DC | PRN
  Administered 2022-06-25: 2 mg via INTRAVENOUS

## 2022-06-25 MED ORDER — METHOCARBAMOL 500 MG PO TABS: 500.0000 mg | ORAL_TABLET | Freq: Four times a day (QID) | ORAL | Status: DC | PRN

## 2022-06-25 MED ORDER — BUPIVACAINE-EPINEPHRINE 0.25% -1:200000 IJ SOLN
INTRAMUSCULAR | Status: DC | PRN
  Administered 2022-06-25: 10 mL

## 2022-06-25 MED ORDER — ONDANSETRON HCL 4 MG/2ML IJ SOLN: 4.0000 mg | Freq: Four times a day (QID) | INTRAMUSCULAR | Status: DC | PRN

## 2022-06-25 MED ORDER — MORPHINE SULFATE (PF) 2 MG/ML IV SOLN: 1.0000 mg | INTRAVENOUS | Status: DC | PRN

## 2022-06-25 MED ORDER — ENOXAPARIN SODIUM 40 MG/0.4ML IJ SOSY
40.0000 mg | PREFILLED_SYRINGE | INTRAMUSCULAR | Status: DC
  Administered 2022-06-26: 40 mg via SUBCUTANEOUS
  Filled 2022-06-25: qty 0.4

## 2022-06-25 MED ORDER — AMISULPRIDE (ANTIEMETIC) 5 MG/2ML IV SOLN: 10.0000 mg | Freq: Once | INTRAVENOUS | Status: DC | PRN

## 2022-06-25 SURGICAL SUPPLY — 37 items
APPLIER CLIP ROT 10 11.4 M/L (STAPLE)
BAG COUNTER SPONGE SURGICOUNT (BAG) IMPLANT
CABLE HIGH FREQUENCY MONO STRZ (ELECTRODE) ×1 IMPLANT
CLIP APPLIE ROT 10 11.4 M/L (STAPLE) IMPLANT
COVER SURGICAL LIGHT HANDLE (MISCELLANEOUS) ×1 IMPLANT
CUTTER FLEX LINEAR 45M (STAPLE) IMPLANT
DERMABOND ADVANCED .7 DNX12 (GAUZE/BANDAGES/DRESSINGS) ×1 IMPLANT
DRAPE LAPAROSCOPIC ABDOMINAL (DRAPES) ×1 IMPLANT
ELECT REM PT RETURN 15FT ADLT (MISCELLANEOUS) ×1 IMPLANT
ENDOLOOP SUT PDS II  0 18 (SUTURE)
ENDOLOOP SUT PDS II 0 18 (SUTURE) IMPLANT
GLOVE BIO SURGEON STRL SZ 6 (GLOVE) ×1 IMPLANT
GLOVE INDICATOR 6.5 STRL GRN (GLOVE) ×1 IMPLANT
GOWN SPEC L3 XXLG W/TWL (GOWN DISPOSABLE) ×1 IMPLANT
GOWN STRL REUS W/TWL 2XL LVL3 (GOWN DISPOSABLE) ×1 IMPLANT
IRRIG SUCT STRYKERFLOW 2 WTIP (MISCELLANEOUS) ×1
IRRIGATION SUCT STRKRFLW 2 WTP (MISCELLANEOUS) ×1 IMPLANT
KIT BASIN OR (CUSTOM PROCEDURE TRAY) ×1 IMPLANT
KIT TURNOVER KIT A (KITS) IMPLANT
PENCIL SMOKE EVACUATOR (MISCELLANEOUS) IMPLANT
RELOAD 45 VASCULAR/THIN (ENDOMECHANICALS) IMPLANT
RELOAD STAPLE 45 2.5 WHT GRN (ENDOMECHANICALS) IMPLANT
RELOAD STAPLE 45 3.5 BLU ETS (ENDOMECHANICALS) IMPLANT
RELOAD STAPLE TA45 3.5 REG BLU (ENDOMECHANICALS) ×1 IMPLANT
SCISSORS LAP 5X35 DISP (ENDOMECHANICALS) ×1 IMPLANT
SET TUBE SMOKE EVAC HIGH FLOW (TUBING) ×1 IMPLANT
SHEARS HARMONIC ACE PLUS 36CM (ENDOMECHANICALS) ×1 IMPLANT
SLEEVE Z-THREAD 5X100MM (TROCAR) ×1 IMPLANT
SPIKE FLUID TRANSFER (MISCELLANEOUS) ×1 IMPLANT
SUT MNCRL AB 4-0 PS2 18 (SUTURE) ×1 IMPLANT
SYS BAG RETRIEVAL 10MM (BASKET) ×1
SYSTEM BAG RETRIEVAL 10MM (BASKET) ×1 IMPLANT
TOWEL OR 17X26 10 PK STRL BLUE (TOWEL DISPOSABLE) ×1 IMPLANT
TRAY FOLEY MTR SLVR 16FR STAT (SET/KITS/TRAYS/PACK) IMPLANT
TRAY LAPAROSCOPIC (CUSTOM PROCEDURE TRAY) ×1 IMPLANT
TROCAR BALLN 12MMX100 BLUNT (TROCAR) ×1 IMPLANT
TROCAR Z-THREAD OPTICAL 5X100M (TROCAR) ×1 IMPLANT

## 2022-06-25 NOTE — Anesthesia Procedure Notes (Signed)
Procedure Name: Intubation Date/Time: 06/25/2022 7:18 PM  Performed by: Gean Maidens, CRNAPre-anesthesia Checklist: Patient identified, Emergency Drugs available, Suction available and Patient being monitored Patient Re-evaluated:Patient Re-evaluated prior to induction Oxygen Delivery Method: Circle system utilized Preoxygenation: Pre-oxygenation with 100% oxygen Induction Type: IV induction and Rapid sequence Laryngoscope Size: 4 and Mac Grade View: Grade I Tube type: Oral Tube size: 7.5 mm Number of attempts: 1 Airway Equipment and Method: Stylet Placement Confirmation: ETT inserted through vocal cords under direct vision, positive ETCO2 and breath sounds checked- equal and bilateral Secured at: 23 cm Tube secured with: Tape Dental Injury: Teeth and Oropharynx as per pre-operative assessment

## 2022-06-25 NOTE — H&P (Signed)
Glen Sullivan is an 21 y.o. male.   Chief Complaint: abdominal pain HPI:  Pt presented to urgent care with 3-4 days of abdominal pain.  He was then sent to the [facility] ED in order to get CT scan.  Pain started in the periumbilical region and migrated to the RLQ.  He is otherwise healthy.  He hasn't had pain like this before.  He denied n/v/d/c/fever.  No CP/SOB, no difficulty urinating or having BM.    History reviewed. No pertinent past medical history.  History reviewed. No pertinent surgical history.  Family History  Problem Relation Age of Onset   Diabetes Father    Diabetes Other    Hypertension Other    Social History:  reports that he has never smoked. He has never used smokeless tobacco. He reports that he does not drink alcohol and does not use drugs.  Allergies: No Known Allergies  No medications prior to admission.    Results for orders placed or performed during the hospital encounter of 06/25/22 (from the past 48 hour(s))  Lipase, blood     Status: None   Collection Time: 06/25/22  3:04 PM  Result Value Ref Range   Lipase 28 11 - 51 U/L    Comment: Performed at [facility], [place] [address] [place], [place] [address]  Comprehensive metabolic panel     Status: Abnormal   Collection Time: 06/25/22  3:04 PM  Result Value Ref Range   Sodium 135 135 - 145 mmol/L   Potassium 3.6 3.5 - 5.1 mmol/L   Chloride 103 98 - 111 mmol/L   CO2 22 22 - 32 mmol/L   Glucose, Bld 109 (H) 70 - 99 mg/dL    Comment: Glucose reference range applies only to samples taken after fasting for at least 8 hours.   BUN 10 6 - 20 mg/dL   Creatinine, Ser 0.87 0.61 - 1.24 mg/dL   Calcium 9.6 8.9 - 10.3 mg/dL   Total Protein 7.9 6.5 - 8.1 g/dL   Albumin 4.8 3.5 - 5.0 g/dL   AST 15 15 - 41 U/L   ALT 14 0 - 44 U/L   Alkaline Phosphatase 61 38 - 126 U/L   Total Bilirubin 1.0 0.3 - 1.2 mg/dL   GFR, Estimated >60 >60 mL/min    Comment: (NOTE) Calculated using the CKD-EPI  Creatinine Equation (2021)    Anion gap 10 5 - 15    Comment: Performed at [facility], [place] [address] [place], [place] [address]  CBC     Status: Abnormal   Collection Time: 06/25/22  3:04 PM  Result Value Ref Range   WBC 12.6 (H) 4.0 - 10.5 K/uL   RBC 4.84 4.22 - 5.81 MIL/uL   Hemoglobin 14.9 13.0 - 17.0 g/dL   HCT 42.4 39.0 - 52.0 %   MCV 87.6 80.0 - 100.0 fL   MCH 30.8 26.0 - 34.0 pg   MCHC 35.1 30.0 - 36.0 g/dL   RDW 12.1 11.5 - 15.5 %   Platelets 196 150 - 400 K/uL   nRBC 0.0 0.0 - 0.2 %    Comment: Performed at [facility], [place] [address] [place], [place] [address]   CT Abdomen Pelvis W Contrast  Result Date: 06/25/2022 CLINICAL DATA:  RIGHT lower quadrant abdominal pain EXAM: CT ABDOMEN AND PELVIS WITH CONTRAST TECHNIQUE: Multidetector CT imaging of the abdomen and pelvis was performed using the standard protocol following bolus administration of intravenous contrast.  RADIATION DOSE REDUCTION: This exam was performed according to the departmental dose-optimization program which includes automated exposure control, adjustment of the mA and/or kV according to patient size and/or use of iterative reconstruction technique. CONTRAST:  142m OMNIPAQUE IOHEXOL 300 MG/ML SOLN IV. No oral contrast. COMPARISON:  None Available. FINDINGS: Lower chest: Lung bases clear Hepatobiliary: Gallbladder and liver normal appearance Pancreas: Normal appearance Spleen: Normal appearance Adrenals/Urinary Tract: Adrenal glands normal appearance. 3.2 cm diameter cyst at mid LEFT kidney containing a mural calcification. Kidneys, ureters, and bladder otherwise normal appearance. Stomach/Bowel: Stomach and bowel loops normal appearance. Acute appendicitis: Appendix: Location: Extending medially and inferior from cecal tip in RIGHT pelvis Diameter: 22 mm Appendicolith: Large 14 mm diameter x 34 mm length appendicolith at mid appendix. Additional 7 mm distal appendicolith.  Mucosal hyper-enhancement: Present Extraluminal gas: Absent Periappendiceal collection: Significant periappendiceal inflammation and edema without defined abscess collection Vascular/Lymphatic: Few reactive mesenteric lymph nodes RIGHT mid abdomen. No additional adenopathy. Vascular structures patent. Reproductive: Unremarkable prostate gland and seminal vesicles Other: Small amount of free fluid in pelvis and at base of RIGHT gutter. No free air. Tiny umbilical hernia containing fat. Musculoskeletal: Unremarkable IMPRESSION: Acute appendicitis with significant periappendiceal inflammation and edema but no evidence of perforation or abscess. 3.2 cm diameter cyst at mid LEFT kidney containing a mural calcification. Tiny umbilical hernia containing fat. Findings called to Dr. ZRogene Houstonon 06/25/2022 at 1658 hours. Electronically Signed   By: MLavonia DanaM.D.   On: 06/25/2022 16:59    Review of Systems  All other systems reviewed and are negative.   Blood pressure (!) 159/84, pulse 98, temperature 98.4 F (36.9 C), temperature source Oral, resp. rate 18, SpO2 100 %. Physical Exam Vitals reviewed.  Constitutional:      General: He is not in acute distress.    Appearance: He is well-developed. He is not ill-appearing or toxic-appearing.  HENT:     Head: Normocephalic and atraumatic.  Eyes:     General: No scleral icterus.    Extraocular Movements: Extraocular movements intact.     Pupils: Pupils are equal, round, and reactive to light.  Cardiovascular:     Rate and Rhythm: Normal rate and regular rhythm.  Pulmonary:     Effort: Pulmonary effort is normal.  Abdominal:     General: Abdomen is flat. There is no abdominal bruit. There are no signs of injury.     Palpations: Abdomen is soft. There is no fluid wave, hepatomegaly, splenomegaly, mass or pulsatile mass.     Tenderness: There is abdominal tenderness (mild) in the right lower quadrant.  Skin:    General: Skin is warm and dry.      Capillary Refill: Capillary refill takes 2 to 3 seconds.     Coloration: Skin is not cyanotic or jaundiced.  Neurological:     General: No focal deficit present.     Mental Status: He is alert and oriented to person, place, and time.  Psychiatric:        Mood and Affect: Mood normal. Mood is not anxious.        Behavior: Behavior normal.      Assessment/Plan Acute appendicitis  NPO IV fluids IV antibiotics Lap appy.   Appendectomy was described to the patient.  The incisions and surgical technique were explained.  The patient was advised that some of the hair on the abdomen would be clipped, and that a foley catheter would be placed.  I advised the patient of the risks of  surgery including, but not limited to, bleeding, infection, damage to other structures, risk of an open operation, risk of abscess, and risk of blood clot.  The recovery was also described to the patient.  He was advised that he will have lifting restrictions for 2 weeks.      Stark Klein, MD 06/25/2022, 6:19 PM

## 2022-06-25 NOTE — Discharge Instructions (Signed)
[facility] ______[facility] LAPAROSCOPIC SURGERY: POST OP INSTRUCTIONS Always review your discharge instruction sheet given to you by the facility where your surgery was performed. IF YOU HAVE DISABILITY OR FAMILY LEAVE FORMS, YOU MUST BRING THEM TO THE OFFICE FOR PROCESSING.   DO NOT GIVE THEM TO YOUR DOCTOR.  A prescription for pain medication may be given to you upon discharge.  Take your pain medication as prescribed, if needed.  If narcotic pain medicine is not needed, then you may take acetaminophen (Tylenol) or ibuprofen (Advil) as needed. Take your usually prescribed medications unless otherwise directed. If you need a refill on your pain medication, please contact your pharmacy.  They will contact our office to request authorization. Prescriptions will not be filled after 5pm or on week-ends. You should follow a light diet the first few days after arrival home, such as soup and crackers, etc.  Be sure to include lots of fluids daily. Most patients will experience some swelling and bruising in the area of the incisions.  Ice packs will help.  Swelling and bruising can take several days to resolve.  It is common to experience some constipation if taking pain medication after surgery.  Increasing fluid intake and taking a stool softener (such as Colace) will usually help or prevent this problem from occurring.  A mild laxative (Milk of Magnesia or Miralax) should be taken according to package instructions if there are no bowel movements after 48 hours. Unless discharge instructions indicate otherwise, you may remove your bandages 24-48 hours after surgery, and you may shower at that time.  You may have steri-strips (small skin tapes) in place directly over the incision.  These strips should be left on the skin for 7-10 days.  If your surgeon used skin glue on the incision, you may shower in 24 hours.  The glue will flake off over the next 2-3 weeks.  Any sutures or staples will be  removed at the office during your follow-up visit. ACTIVITIES:  You may resume regular (light) daily activities beginning the next day--such as daily self-care, walking, climbing stairs--gradually increasing activities as tolerated.  You may have sexual intercourse when it is comfortable.  Refrain from any heavy lifting or straining until approved by your doctor. You may drive when you are no longer taking prescription pain medication, you can comfortably wear a seatbelt, and you can safely maneuver your car and apply brakes. RETURN TO WORK:  __________________________________________________________ You should see your doctor in the office for a follow-up appointment approximately 2-3 weeks after your surgery.  Make sure that you call for this appointment within a day or two after you arrive home to insure a convenient appointment time. OTHER INSTRUCTIONS: __________________________________________________________________________________________________________________________ __________________________________________________________________________________________________________________________ WHEN TO CALL YOUR DOCTOR: Fever over 101.0 Inability to urinate Continued bleeding from incision. Increased pain, redness, or drainage from the incision. Increasing abdominal pain  The clinic staff is available to answer your questions during regular business hours.  Please don't hesitate to call and ask to speak to one of the nurses for clinical concerns.  If you have a medical emergency, go to the nearest emergency room or call 911.  A surgeon from [facility] is always on call at the hospital. [address], [address], [place], [place]  [address] ? [address] [address], [place], [place]   [address] [phone] ? [phone] ? FAX [phone] Web site: [email or web address]  

## 2022-06-25 NOTE — Transfer of Care (Signed)
Immediate Anesthesia Transfer of Care Note  Patient: Glen Sullivan  Procedure(s) Performed: APPENDECTOMY LAPAROSCOPIC  Patient Location: PACU  Anesthesia Type:General  Level of Consciousness: sedated, patient cooperative and responds to stimulation  Airway & Oxygen Therapy: Patient Spontanous Breathing and Patient connected to face mask oxygen  Post-op Assessment: Report given to RN and Post -op Vital signs reviewed and stable  Post vital signs: Reviewed and stable  Last Vitals:  Vitals Value Taken Time  BP 149/76 06/25/22 2028  Temp    Pulse 107 06/25/22 2030  Resp 29 06/25/22 2030  SpO2 100 % 06/25/22 2030  Vitals shown include unvalidated device data.  Last Pain:  Vitals:   06/25/22 1826  TempSrc: Oral  PainSc: 2       Patients Stated Pain Goal: 0 (89/38/10 1751)  Complications: No notable events documented.

## 2022-06-25 NOTE — Anesthesia Preprocedure Evaluation (Signed)
Anesthesia Evaluation  Patient identified by MRN, date of birth, ID band Patient awake    Reviewed: Allergy & Precautions, NPO status , Patient's Chart, lab work & pertinent test results  Airway Mallampati: II  TM Distance: >3 FB Neck ROM: Full    Dental no notable dental hx.    Pulmonary neg pulmonary ROS,    Pulmonary exam normal breath sounds clear to auscultation       Cardiovascular negative cardio ROS Normal cardiovascular exam Rhythm:Regular Rate:Normal     Neuro/Psych negative neurological ROS  negative psych ROS   GI/Hepatic negative GI ROS, Neg liver ROS,   Endo/Other  negative endocrine ROS  Renal/GU negative Renal ROS  negative genitourinary   Musculoskeletal negative musculoskeletal ROS (+)   Abdominal   Peds negative pediatric ROS (+)  Hematology negative hematology ROS (+)   Anesthesia Other Findings Acute Appendicitis  Reproductive/Obstetrics negative OB ROS                             Anesthesia Physical Anesthesia Plan  ASA: 2 and emergent  Anesthesia Plan: General   Post-op Pain Management: Dilaudid IV   Induction: Intravenous  PONV Risk Score and Plan: 2 and Ondansetron, Midazolam and Treatment may vary due to age or medical condition  Airway Management Planned: Oral ETT  Additional Equipment:   Intra-op Plan:   Post-operative Plan: Extubation in OR  Informed Consent: I have reviewed the patients History and Physical, chart, labs and discussed the procedure including the risks, benefits and alternatives for the proposed anesthesia with the patient or authorized representative who has indicated his/her understanding and acceptance.     Dental advisory given  Plan Discussed with: CRNA  Anesthesia Plan Comments:         Anesthesia Quick Evaluation

## 2022-06-25 NOTE — ED Notes (Signed)
Pt transported to OR. Rocephin sent w/ transport.

## 2022-06-25 NOTE — ED Provider Notes (Signed)
[facility] DEPT Provider Note   CSN: [id number] Arrival date & time: 06/25/22  1341     History  Chief Complaint  Patient presents with   Abdominal Pain    Glen Sullivan is a 21 y.o. male. Presenting to the emergency department with chief complaint of abdominal pain.  He says that started about 3 to 4 days ago and was in the middle of his abdomen and has now moved lower closer to the right side.  He was seen by urgent care this morning who sent him here for CT evaluation for appendicitis.  He denies any nausea, vomiting, diarrhea, constipation, flank pain, dysuria, hematuria, testicular pain, shortness of breath, chest pain, fevers, or chills.   Abdominal Pain      Home Medications Prior to Admission medications   Medication Sig Start Date End Date Taking? Authorizing Provider  acetaminophen (TYLENOL) 500 MG tablet Take 500 mg by mouth every 6 (six) hours as needed.   Yes [provider]  bismuth subsalicylate (PEPTO BISMOL) 262 MG/15ML suspension Take 30 mLs by mouth every 6 (six) hours as needed for indigestion.   Yes [provider]      Allergies    Patient has no known allergies.    Review of Systems   Review of Systems  Gastrointestinal:  Positive for abdominal pain.  All other systems reviewed and are negative.   Physical Exam Updated Vital Signs BP (!) 144/84   Pulse (!) 107   Temp (!) 101.4 F (38.6 C) (Oral)   Resp 16   Ht '5\' 8"'$  (1.727 m)   Wt 101.6 kg   SpO2 100%   BMI 34.06 kg/m  Physical Exam Vitals and nursing note reviewed.  Constitutional:      General: He is not in acute distress.    Appearance: Normal appearance. He is not ill-appearing, toxic-appearing or diaphoretic.  HENT:     Head: Normocephalic and atraumatic.     Nose: No nasal deformity.     Mouth/Throat:     Lips: Pink. No lesions.     Mouth: Mucous membranes are moist. No injury, lacerations, oral lesions or angioedema.      Pharynx: Oropharynx is clear. Uvula midline. No pharyngeal swelling, oropharyngeal exudate, posterior oropharyngeal erythema or uvula swelling.  Eyes:     General: Gaze aligned appropriately. No scleral icterus.       Right eye: No discharge.        Left eye: No discharge.     Conjunctiva/sclera: Conjunctivae normal.     Right eye: Right conjunctiva is not injected. No exudate or hemorrhage.    Left eye: Left conjunctiva is not injected. No exudate or hemorrhage.    Pupils: Pupils are equal, round, and reactive to light.  Cardiovascular:     Rate and Rhythm: Normal rate and regular rhythm.     Pulses: Normal pulses.          Radial pulses are 2+ on the right side and 2+ on the left side.       Dorsalis pedis pulses are 2+ on the right side and 2+ on the left side.     Heart sounds: Normal heart sounds, S1 normal and S2 normal. Heart sounds not distant. No murmur heard.    No friction rub. No gallop. No S3 or S4 sounds.  Pulmonary:     Effort: Pulmonary effort is normal. No accessory muscle usage or respiratory distress.     Breath sounds: Normal breath  sounds. No stridor. No wheezing, rhonchi or rales.  Chest:     Chest wall: No tenderness.  Abdominal:     General: Abdomen is flat. There is no distension.     Palpations: Abdomen is soft. There is no mass or pulsatile mass.     Tenderness: There is abdominal tenderness. There is no guarding or rebound. Positive signs include McBurney's sign.     Hernia: No hernia is present.  Musculoskeletal:     Right lower leg: No edema.     Left lower leg: No edema.  Skin:    General: Skin is warm and dry.     Coloration: Skin is not jaundiced or pale.     Findings: No bruising, erythema, lesion or rash.  Neurological:     General: No focal deficit present.     Mental Status: He is alert and oriented to person, place, and time.     GCS: GCS eye subscore is 4. GCS verbal subscore is 5. GCS motor subscore is 6.  Psychiatric:        Mood and  Affect: Mood normal.        Behavior: Behavior normal. Behavior is cooperative.     ED Results / Procedures / Treatments   Labs (all labs ordered are listed, but only abnormal results are displayed) Labs Reviewed  COMPREHENSIVE METABOLIC PANEL - Abnormal; Notable for the following components:      Result Value   Glucose, Bld 109 (*)    All other components within normal limits  CBC - Abnormal; Notable for the following components:   WBC 12.6 (*)    All other components within normal limits  LIPASE, BLOOD  URINALYSIS, ROUTINE W REFLEX MICROSCOPIC    EKG None  Radiology CT Abdomen Pelvis W Contrast  Result Date: 06/25/2022 CLINICAL DATA:  RIGHT lower quadrant abdominal pain EXAM: CT ABDOMEN AND PELVIS WITH CONTRAST TECHNIQUE: Multidetector CT imaging of the abdomen and pelvis was performed using the standard protocol following bolus administration of intravenous contrast. RADIATION DOSE REDUCTION: This exam was performed according to the departmental dose-optimization program which includes automated exposure control, adjustment of the mA and/or kV according to patient size and/or use of iterative reconstruction technique. CONTRAST:  156m OMNIPAQUE IOHEXOL 300 MG/ML SOLN IV. No oral contrast. COMPARISON:  None Available. FINDINGS: Lower chest: Lung bases clear Hepatobiliary: Gallbladder and liver normal appearance Pancreas: Normal appearance Spleen: Normal appearance Adrenals/Urinary Tract: Adrenal glands normal appearance. 3.2 cm diameter cyst at mid LEFT kidney containing a mural calcification. Kidneys, ureters, and bladder otherwise normal appearance. Stomach/Bowel: Stomach and bowel loops normal appearance. Acute appendicitis: Appendix: Location: Extending medially and inferior from cecal tip in RIGHT pelvis Diameter: 22 mm Appendicolith: Large 14 mm diameter x 34 mm length appendicolith at mid appendix. Additional 7 mm distal appendicolith. Mucosal hyper-enhancement: Present  Extraluminal gas: Absent Periappendiceal collection: Significant periappendiceal inflammation and edema without defined abscess collection Vascular/Lymphatic: Few reactive mesenteric lymph nodes RIGHT mid abdomen. No additional adenopathy. Vascular structures patent. Reproductive: Unremarkable prostate gland and seminal vesicles Other: Small amount of free fluid in pelvis and at base of RIGHT gutter. No free air. Tiny umbilical hernia containing fat. Musculoskeletal: Unremarkable IMPRESSION: Acute appendicitis with significant periappendiceal inflammation and edema but no evidence of perforation or abscess. 3.2 cm diameter cyst at mid LEFT kidney containing a mural calcification. Tiny umbilical hernia containing fat. Findings called to Dr. ZRogene Houstonon 06/25/2022 at 1658 hours. Electronically Signed   By: MCrist InfanteD.  On: 06/25/2022 16:59    Procedures Procedures    Medications Ordered in ED Medications  sodium chloride (PF) 0.9 % injection (has no administration in time range)  cefTRIAXone (ROCEPHIN) 2 g in sodium chloride 0.9 % 100 mL IVPB ( Intravenous MAR Hold 06/25/22 1816)  metroNIDAZOLE (FLAGYL) IVPB 500 mg ( Intravenous MAR Hold 06/25/22 1816)  iohexol (OMNIPAQUE) 300 MG/ML solution 100 mL (100 mLs Intravenous Contrast Given 06/25/22 1638)    ED Course/ Medical Decision Making/ A&P Clinical Course as of 06/25/22 1833  Mon Jun 25, 2022  1755 I discussed case with Dr. Barry Dienes regarding appendicitis. Plan is for patient to be pended to OR. [GL]    Clinical Course User Index [GL] Sherre Poot Adora Fridge, PA-C                           Medical Decision Making Amount and/or Complexity of Data Reviewed Labs: ordered. Radiology: ordered.  Risk Prescription drug management. Decision regarding hospitalization.    MDM  This is a 21 y.o. male who presents to the ED with lower abdominal pain The differential of this patient includes but is not limited to Appendicitis, hernia,  Diverticulitis, Testicular Torsion, UTI, Constipation, Epididymitis/Orchitis   Initial Impression  Patient is well-appearing and in no acute distress.  He has stable vitals.  I personally ordered, reviewed, and interpreted all laboratory work and imaging and agree with radiologist interpretation. Results interpreted below: White blood cell count is 12.6.  CMP otherwise unremarkable.  Lipase 28.  Urinalysis pending.  CT abdomen pelvis is concerning for acute appendicitis without any sign of perforation or abscess.  Assessment/Plan:  I have consulted general surgery and spoke with Dr. Barry Dienes. Plan is for patient to go to the OR.    Charting Requirements Additional history is obtained from:  Independent historian External Records from outside source obtained and reviewed including: Urgent Care note Social Determinants of Health:  none Pertinant PMH that complicates patient's illness: n/a  Patient Care Problems that were addressed during this visit: - Acute appendicitis: Acute illness with complication Medications given in ED: n/a Reevaluation of the patient after these medicines showed that the patient stayed the same I have reviewed home medications and made changes accordingly.  Critical Care Interventions: n/a Consultations: general surgery Disposition: to OR  This is a supervised visit with my attending physician, Dr. Tomi Bamberger. We have discussed this patient and they have altered the plan as needed.  Portions of this note were generated with [employer]. Dictation errors may occur despite best attempts at proofreading.    Final Clinical Impression(s) / ED Diagnoses Final diagnoses:  Acute appendicitis, unspecified acute appendicitis type    Rx / DC Orders ED Discharge Orders     None         Adolphus Birchwood, PA-C 06/25/22 1833    Dorie Rank, MD 06/26/22 [phone]

## 2022-06-25 NOTE — Op Note (Signed)
Appendectomy, Lap, Procedure Note  Indications: The patient presented with a history of right-sided abdominal pain. A CT revealed findings consistent with acute appendicitis.  Pre-operative Diagnosis: acute appendicitis  Post-operative Diagnosis: acute perforated appendicitis  Surgeon: Stark Klein   Assistants: n/a  Anesthesia: General endotracheal anesthesia and Local anesthesia 1% plain lidocaine, 0.25.% bupivacaine, with epinephrine  ASA Class: 2E  Procedure Details  The patient was seen again in the Holding Room. The risks, benefits, complications, treatment options, and expected outcomes were discussed with the patient and/or family. The possibilities of perforation of viscus, bleeding, recurrent infection, the need for additional procedures, failure to diagnose a condition, and creating a complication requiring transfusion or operation were discussed. There was concurrence with the proposed plan and informed consent was obtained. The site of surgery was properly noted. The patient was taken to Operating Room, identified as Glen Sullivan and the procedure verified as Appendectomy. A Time Out was held and the above information confirmed.  The patient was placed in the supine position and general anesthesia was induced, along with placement of orogastric tube, Venodyne boots, and a Foley catheter. The abdomen was prepped and draped in a sterile fashion. Local anesthetic was infiltrated in the infraumbilical region.  A 1.5 cm vertical incision was made just below the umbilicus.  The Kelly clamp was used to spread the subcutaneous tissues.  The fascia was elevated with 2 Kocher clamps and incised with the #11 blade.  A Claiborne Billings was used to confirm entrance into the peritoneal cavity.  A pursestring suture was placed around the fascial incision.  The Hasson trocar was inserted into the abdomen and held in place with the tails of the suture.  Some of the air dissected into the preperitoneal  space. The trocar was repositioned and this was better.  The pneumoperitoneum was then established to steady pressure of 15 mmHg.     Additional 5 mm cannulas then placed in the left lower quadrant of the abdomen and the suprapubic region under direct visualization.  A careful evaluation of the entire abdomen was carried out. The patient was placed in Trendelenburg and rotated to the left.  The small intestines were retracted in the cephalad and left lateral direction away from the pelvis and right lower quadrant. The patient was found to have an enlarged and inflamed appendix in the pelvic position. There was not immediate evidence of perforation.  The appendix was carefully dissected. It was tense and distended due to the appendicoliths.  The appendix was was skeletonized with the harmonic scalpel.   It was difficult to grasp and the mesoappendix was primarily the location of retraction.  Around 1 cm distal to the base of the appendix, an abscess was identified with a perforation of the appendix.  This was able to be mobilized such that there was healthy appendix at the base at the cecum.  The appendix was divided at its base using an endo-GIA stapler. No appendiceal stump was left in place. The appendix was removed from the abdomen with an Endocatch bag through the umbilical port.  There was no evidence of bleeding, leakage, or complication after division of the appendix. The mesoappendix was very inflamed and pushed forward the TI.  A 4 quadrant inspection was performed.  No evidence of bleeding or succus was seen.  The bowel appeared to be intact without injury.  The gallbladder and liver appeared normal.  Irrigation was also performed and irrigate suctioned from the abdomen as well.  The 5 mm trocars  were removed.  The pneumoperitoneum was evacuated from the abdomen.    The trocar site skin wounds were closed with 4-0 Monocryl and dressed with Dermabond.  Instrument, sponge, and needle counts were  correct at the conclusion of the case.   Findings: The appendix was found to be inflamed. There  was  signs of necrosis around 1 cm from the base of the appendix for around 1 cm.  There was perforation. There was abscess formation.  Estimated Blood Loss:  Minimal         Drains: none          Specimens: appendix         Complications:  None; patient tolerated the procedure well.         Disposition: PACU - hemodynamically stable.         Condition: stable  CASE DATA:  Type of patient?: LDOW CASE (Surgical Hospitalist [facility] Inpatient)  Status of Case? URGENT Add On  Infection Present At Time Of Surgery (PATOS)?  PURULENCE at the base of the appendix

## 2022-06-25 NOTE — ED Triage Notes (Addendum)
Patient c/o abdominal pain x4 days. Denies N/V/D. Last BM this morning. States seen at [facility] this morning for same. States labs and XR at [facility] today.

## 2022-06-26 ENCOUNTER — Encounter (HOSPITAL_COMMUNITY): Payer: Self-pay | Admitting: General Surgery

## 2022-06-26 DIAGNOSIS — K3532 Acute appendicitis with perforation and localized peritonitis, without abscess: Secondary | ICD-10-CM | POA: Diagnosis not present

## 2022-06-26 LAB — CBC
HCT: 40 % (ref 39.0–52.0)
Hemoglobin: 13.9 g/dL (ref 13.0–17.0)
MCH: 30.4 pg (ref 26.0–34.0)
MCHC: 34.8 g/dL (ref 30.0–36.0)
MCV: 87.5 fL (ref 80.0–100.0)
Platelets: 168 10*3/uL (ref 150–400)
RBC: 4.57 MIL/uL (ref 4.22–5.81)
RDW: 12.2 % (ref 11.5–15.5)
WBC: 12.7 10*3/uL — ABNORMAL HIGH (ref 4.0–10.5)
nRBC: 0 % (ref 0.0–0.2)

## 2022-06-26 LAB — BASIC METABOLIC PANEL
Anion gap: 9 (ref 5–15)
BUN: 9 mg/dL (ref 6–20)
CO2: 25 mmol/L (ref 22–32)
Calcium: 9.4 mg/dL (ref 8.9–10.3)
Chloride: 103 mmol/L (ref 98–111)
Creatinine, Ser: 0.77 mg/dL (ref 0.61–1.24)
GFR, Estimated: >60 mL/min (ref >60)
Glucose, Bld: 162 mg/dL — ABNORMAL HIGH (ref 70–99)
Potassium: 4.3 mmol/L (ref 3.5–5.1)
Sodium: 137 mmol/L (ref 135–145)

## 2022-06-26 MED ORDER — TRAMADOL HCL 50 MG PO TABS: 50.0000 mg | ORAL_TABLET | Freq: Four times a day (QID) | ORAL | 0 refills | Status: AC | PRN

## 2022-06-26 MED ORDER — AMOXICILLIN-POT CLAVULANATE 875-125 MG PO TABS: 1.0000 | ORAL_TABLET | Freq: Two times a day (BID) | ORAL | 0 refills | Status: AC

## 2022-06-26 MED ORDER — IBUPROFEN 200 MG PO TABS: 600.0000 mg | ORAL_TABLET | Freq: Four times a day (QID) | ORAL | Status: DC | PRN

## 2022-06-26 MED ORDER — POLYETHYLENE GLYCOL 3350 17 G PO PACK: 17.0000 g | PACK | Freq: Every day | ORAL | 0 refills | Status: DC | PRN

## 2022-06-26 NOTE — Discharge Summary (Signed)
[facility] Discharge Summary   Patient ID: Glen Sullivan MRN: [id number] DOB/AGE: [birth year] 23 y.o.  Admit date: 06/25/2022 Discharge date: 06/26/2022  Admitting Diagnosis: Acute appendicitis, unspecified acute appendicitis type [K35.80] Acute perforated appendicitis [K35.32] S/P appendectomy [Z90.49]   Discharge Diagnosis Acute appendicitis, unspecified acute appendicitis type [K35.80] Acute perforated appendicitis [K35.32] S/P appendectomy [Z90.49]   Consultants None   Imaging: CT Abdomen Pelvis W Contrast  Result Date: 06/25/2022 CLINICAL DATA:  RIGHT lower quadrant abdominal pain EXAM: CT ABDOMEN AND PELVIS WITH CONTRAST TECHNIQUE: Multidetector CT imaging of the abdomen and pelvis was performed using the standard protocol following bolus administration of intravenous contrast. RADIATION DOSE REDUCTION: This exam was performed according to the departmental dose-optimization program which includes automated exposure control, adjustment of the mA and/or kV according to patient size and/or use of iterative reconstruction technique. CONTRAST:  162m OMNIPAQUE IOHEXOL 300 MG/ML SOLN IV. No oral contrast. COMPARISON:  None Available. FINDINGS: Lower chest: Lung bases clear Hepatobiliary: Gallbladder and liver normal appearance Pancreas: Normal appearance Spleen: Normal appearance Adrenals/Urinary Tract: Adrenal glands normal appearance. 3.2 cm diameter cyst at mid LEFT kidney containing a mural calcification. Kidneys, ureters, and bladder otherwise normal appearance. Stomach/Bowel: Stomach and bowel loops normal appearance. Acute appendicitis: Appendix: Location: Extending medially and inferior from cecal tip in RIGHT pelvis Diameter: 22 mm Appendicolith: Large 14 mm diameter x 34 mm length appendicolith at mid appendix. Additional 7 mm distal appendicolith. Mucosal hyper-enhancement: Present Extraluminal gas: Absent Periappendiceal collection: Significant  periappendiceal inflammation and edema without defined abscess collection Vascular/Lymphatic: Few reactive mesenteric lymph nodes RIGHT mid abdomen. No additional adenopathy. Vascular structures patent. Reproductive: Unremarkable prostate gland and seminal vesicles Other: Small amount of free fluid in pelvis and at base of RIGHT gutter. No free air. Tiny umbilical hernia containing fat. Musculoskeletal: Unremarkable IMPRESSION: Acute appendicitis with significant periappendiceal inflammation and edema but no evidence of perforation or abscess. 3.2 cm diameter cyst at mid LEFT kidney containing a mural calcification. Tiny umbilical hernia containing fat. Findings called to Dr. ZRogene Houstonon 06/25/2022 at 1658 hours. Electronically Signed   By: Glen DanaM.D.   On: 06/25/2022 16:59    Procedures Dr. BBarry Dienes(06/25/22) - Laparoscopic Appendectomy  Hospital Course:  21year old [facility].  Workup showed acute appendicitis.  Patient was admitted and underwent procedure listed above.  Tolerated procedure well and was transferred to the floor. There were surgical findings of a appendix inflammation and necrosis with perforation and abscess formation. Diet was advanced as tolerated.  On POD1, the patient was voiding well, tolerating diet, ambulating well, pain well controlled, vital signs stable, incisions c/d/i and felt stable for discharge home. He will discharge on oral antibiotic course. Return precautions were discussed in detail. Patient will follow up in our office in 2 weeks and knows to call with questions or concerns.    I or a member of my team have reviewed this patient in the Controlled Substance Database.   Allergies as of 06/26/2022   No Known Allergies      Medication List     TAKE these medications    acetaminophen 500 MG tablet Commonly known as: TYLENOL Take 500 mg by mouth every 6 (six) hours as needed for mild pain.    amoxicillin-clavulanate 875-125 MG tablet Commonly known as: AUGMENTIN Take 1 tablet by mouth 2 (two) times daily for 5 days.   bismuth subsalicylate 2681MEX/51ZGsuspension Commonly known as: PEPTO BISMOL Take 30  mLs by mouth every 6 (six) hours as needed for indigestion.   ibuprofen 200 MG tablet Commonly known as: ADVIL Take 3 tablets (600 mg total) by mouth every 6 (six) hours as needed for moderate pain.   polyethylene glycol 17 g packet Commonly known as: MiraLax Take 17 g by mouth daily as needed for mild constipation.   traMADol 50 MG tablet Commonly known as: ULTRAM Take 1 tablet (50 mg total) by mouth every 6 (six) hours as needed for severe pain.          Follow-up Information     Maczis, Carlena Hurl, [place]. Go on 07/17/2022.   Specialty: General Surgery Why: Your appointment is 07/17/22 at 10 am  Arrive 46mn early to check in, fill out paperwork, Bring photo ID and insurance information Contact information: [phone]                Signed: MCaroll RancherCEl [facility] 06/26/2022, 11:25 AM Please see Amion for pager number during day hours 7:00am-4:30pm

## 2022-06-26 NOTE — Progress Notes (Signed)
Mobility Specialist - Progress Note   06/26/22 1122  Mobility  HOB Elevated/Bed Position Self regulated  Activity Ambulated independently in hallway  Range of Motion/Exercises Active  Level of Assistance Independent  Assistive Device None  Distance Ambulated (ft) 500 ft  Activity Response Tolerated well  Transport method Ambulatory  $Mobility charge 1 Mobility   Pt received in bed and agreeable to mobility.  Pt to bed after session with all needs met.    [facility]

## 2022-06-26 NOTE — TOC CM/SW Note (Signed)
  Transition of Care [facility]) Screening Note   Patient Details  Name: Glen Sullivan Date of Birth: [birth year]   Transition of Care [facility]) CM/SW Contact:    Ross Ludwig, LCSW Phone Number: 06/26/2022, 11:43 AM    Transition of Care Department [facility]) has reviewed patient and no TOC needs have been identified at this time. We will continue to monitor patient advancement through interdisciplinary progression rounds. If new patient transition needs arise, please place a TOC consult.

## 2022-06-26 NOTE — Anesthesia Postprocedure Evaluation (Signed)
Anesthesia Post Note  Patient: Glen Sullivan  Procedure(s) Performed: APPENDECTOMY LAPAROSCOPIC     Patient location during evaluation: PACU Anesthesia Type: General Level of consciousness: awake and alert Pain management: pain level controlled Vital Signs Assessment: post-procedure vital signs reviewed and stable Respiratory status: spontaneous breathing, nonlabored ventilation and respiratory function stable Cardiovascular status: blood pressure returned to baseline and stable Postop Assessment: no apparent nausea or vomiting Anesthetic complications: no   No notable events documented.  Last Vitals:  Vitals:   06/26/22 0032 06/26/22 0442  BP: 139/61 130/69  Pulse: 99 91  Resp: 18 19  Temp: 37 C 36.8 C  SpO2: 97% 99%    Last Pain:  Vitals:   06/26/22 0503  TempSrc:   PainSc: [place]

## 2022-06-26 NOTE — Progress Notes (Signed)
Progress Note  1 Day Post-Op  Subjective: CC: Having some expected postoperative abdominal pain.  Improved with pain medications.  He has ambulated in the hallway.  He has passed flatus.  So far only had clear liquids but no worsening abdominal pain, nausea, emesis.   Objective: Vital signs in last 24 hours: Temp:  [97.9 F (36.6 C)-101.4 F (38.6 C)] 98.2 F (36.8 C) (09/26 0442) Pulse Rate:  [86-111] 91 (09/26 0442) Resp:  [16-29] 19 (09/26 0442) BP: (130-159)/(61-94) 130/69 (09/26 0442) SpO2:  [96 %-100 %] 99 % (09/26 0442) Weight:  [101.6 kg] 101.6 kg (09/25 1826) Last BM Date : 06/25/22  Intake/Output from previous day: 09/25 0701 - 09/26 0700 In: 2474.1 [P.O.:540; I.V.:1728.5; IV Piggyback:205.6] Out: 760 [Urine:750; Blood:10] Intake/Output this shift: No intake/output data recorded.  PE: General: pleasant, WD, male who is laying in bed in NAD Lungs:  Respiratory effort nonlabored Abd: soft, ND, mild expected postoperative tenderness over incisions which are C/D/I with surgical glue MSK: all 4 extremities are symmetrical with no cyanosis, clubbing, or edema. Skin: warm and dry with no masses, lesions, or rashes Psych: A&Ox3 with an appropriate affect.    Lab Results:  Recent Labs    06/25/22 1504 06/26/22 0453  WBC 12.6* 12.7*  HGB 14.9 13.9  HCT 42.4 40.0  PLT 196 168   BMET Recent Labs    06/25/22 1504 06/26/22 0453  NA 135 137  K 3.6 4.3  CL 103 103  CO2 22 25  GLUCOSE 109* 162*  BUN 10 9  CREATININE 0.87 0.77  CALCIUM 9.6 9.4   PT/INR No results for input(s): "LABPROT", "INR" in the last 72 hours. CMP     Component Value Date/Time   NA 137 06/26/2022 0453   K 4.3 06/26/2022 0453   CL 103 06/26/2022 0453   CO2 25 06/26/2022 0453   GLUCOSE 162 (H) 06/26/2022 0453   BUN 9 06/26/2022 0453   CREATININE 0.77 06/26/2022 0453   CALCIUM 9.4 06/26/2022 0453   PROT 7.9 06/25/2022 1504   ALBUMIN 4.8 06/25/2022 1504   AST 15 06/25/2022 1504    ALT 14 06/25/2022 1504   ALKPHOS 61 06/25/2022 1504   BILITOT 1.0 06/25/2022 1504   GFRNONAA >60 06/26/2022 0453   Lipase     Component Value Date/Time   LIPASE 28 06/25/2022 1504       Studies/Results: CT Abdomen Pelvis W Contrast  Result Date: 06/25/2022 CLINICAL DATA:  RIGHT lower quadrant abdominal pain EXAM: CT ABDOMEN AND PELVIS WITH CONTRAST TECHNIQUE: Multidetector CT imaging of the abdomen and pelvis was performed using the standard protocol following bolus administration of intravenous contrast. RADIATION DOSE REDUCTION: This exam was performed according to the departmental dose-optimization program which includes automated exposure control, adjustment of the mA and/or kV according to patient size and/or use of iterative reconstruction technique. CONTRAST:  161m OMNIPAQUE IOHEXOL 300 MG/ML SOLN IV. No oral contrast. COMPARISON:  None Available. FINDINGS: Lower chest: Lung bases clear Hepatobiliary: Gallbladder and liver normal appearance Pancreas: Normal appearance Spleen: Normal appearance Adrenals/Urinary Tract: Adrenal glands normal appearance. 3.2 cm diameter cyst at mid LEFT kidney containing a mural calcification. Kidneys, ureters, and bladder otherwise normal appearance. Stomach/Bowel: Stomach and bowel loops normal appearance. Acute appendicitis: Appendix: Location: Extending medially and inferior from cecal tip in RIGHT pelvis Diameter: 22 mm Appendicolith: Large 14 mm diameter x 34 mm length appendicolith at mid appendix. Additional 7 mm distal appendicolith. Mucosal hyper-enhancement: Present Extraluminal gas: Absent Periappendiceal collection: Significant  periappendiceal inflammation and edema without defined abscess collection Vascular/Lymphatic: Few reactive mesenteric lymph nodes RIGHT mid abdomen. No additional adenopathy. Vascular structures patent. Reproductive: Unremarkable prostate gland and seminal vesicles Other: Small amount of free fluid in pelvis and at base of  RIGHT gutter. No free air. Tiny umbilical hernia containing fat. Musculoskeletal: Unremarkable IMPRESSION: Acute appendicitis with significant periappendiceal inflammation and edema but no evidence of perforation or abscess. 3.2 cm diameter cyst at mid LEFT kidney containing a mural calcification. Tiny umbilical hernia containing fat. Findings called to Dr. Rogene Houston on 06/25/2022 at 1658 hours. Electronically Signed   By: Lavonia Dana M.D.   On: 06/25/2022 16:59    Anti-infectives: Anti-infectives (From admission, onward)    Start     Dose/Rate Route Frequency Ordered Stop   06/26/22 1800  cefTRIAXone (ROCEPHIN) 2 g in sodium chloride 0.9 % 100 mL IVPB       See Hyperspace for full Linked Orders Report.   2 g 200 mL/hr over 30 Minutes Intravenous Every 24 hours 06/25/22 2124 07/01/22 1759   06/26/22 0600  metroNIDAZOLE (FLAGYL) IVPB 500 mg       See Hyperspace for full Linked Orders Report.   500 mg 100 mL/hr over 60 Minutes Intravenous Every 12 hours 06/25/22 2124 07/01/22 0559   06/25/22 1815  cefTRIAXone (ROCEPHIN) 2 g in sodium chloride 0.9 % 100 mL IVPB       Note to Pharmacy: Pharmacy may adjust dosing strength / duration / interval for maximal efficacy   2 g 200 mL/hr over 30 Minutes Intravenous  Once 06/25/22 1800 06/25/22 1916   06/25/22 1815  metroNIDAZOLE (FLAGYL) IVPB 500 mg        500 mg 100 mL/hr over 60 Minutes Intravenous  Once 06/25/22 1800 06/25/22 1916        Assessment/Plan Appendicitis with perforation and abscess Postop day 1 status post laparoscopic appendectomy by Dr. Barry Dienes 9/25 -He has risk for postoperative ileus but doing well so far.  Regular diet this a.m.  Discharge this afternoon on oral antibiotics if tolerates.  FEN: Regular ID: Rocephin/Flagyl VTE: Lovenox    LOS: 1 day   [facility] 06/26/2022, 9:27 AM Please see Amion for pager number during day hours 7:00am-4:30pm

## 2022-06-26 NOTE — Plan of Care (Signed)
Patient is stable for discharge. Discharge orders given and understood. Patient is discharged home with mother.

## 2022-06-27 ENCOUNTER — Other Ambulatory Visit: Payer: Self-pay

## 2022-07-02 LAB — SURGICAL PATHOLOGY

## 2022-07-25 ENCOUNTER — Encounter: Payer: Self-pay | Admitting: Physician Assistant

## 2022-09-03 ENCOUNTER — Encounter: Payer: Self-pay | Admitting: Physician Assistant

## 2022-09-03 ENCOUNTER — Ambulatory Visit (INDEPENDENT_AMBULATORY_CARE_PROVIDER_SITE_OTHER): Payer: BLUE CROSS/BLUE SHIELD | Admitting: Physician Assistant

## 2022-09-03 VITALS — BP 142/68 | HR 63 | Ht 69.0 in | Wt 224.1 lb

## 2022-09-03 DIAGNOSIS — K3532 Acute appendicitis with perforation and localized peritonitis, without abscess: Secondary | ICD-10-CM | POA: Diagnosis not present

## 2022-09-03 DIAGNOSIS — D3A020 Benign carcinoid tumor of the appendix: Secondary | ICD-10-CM | POA: Diagnosis not present

## 2022-09-03 NOTE — Progress Notes (Signed)
Subjective:    Patient ID: Glen Sullivan, male    DOB: 2000/10/28, 21 y.o.   MRN: [id number]  HPI Glen Sullivan is a pleasant 21 year old Hispanic male, new to GI today referred by Dr. Dellie Burns for follow-up management after he had presented with an acute perforated appendix September 2023 and underwent laparoscopic appendectomy on the day of admission 06/25/2022.  He did have associated abscess found at the time of surgery.  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  Tumor 0.5 cm He was seen in follow-up by Dr. Barry Dienes on 07/20/2022, doing well postoperatively.  Tumor was a PT1N0.  She noted that the appendix was perforated but this was around 1 cm from the cecum and the tumor was at the tip of the appendix. Her plan was to get a chromogranin A- which resulted as normal at 33.5, and with plan to do a follow-up CT in 1 year, however he was referred here.  He says he feels fine, occasionally gets a little twinge of discomfort in his abdomen but has been eating well without difficulty, has not had any changes or problems with bowel movements, and feels back to his baseline.  Patient mentions that he thought that he was told that he may need a colonoscopy.  No family history of GI disease that he is aware of.  Review of Systems Pertinent positive and negative review of systems were noted in the above HPI section.  All other review of systems was otherwise negative.   Outpatient Encounter Medications as of 09/03/2022  Medication Sig   Blood Pressure Monitoring (BLOOD PRESSURE KIT) KIT Use to check blood pressure twice daily before breakfast and at bedtime, write down your BP and HR and return to clinic during your follow up visits   hydrochlorothiazide (HYDRODIURIL) 12.5 MG tablet Take 12.5 mg by mouth.   traMADol (ULTRAM)  50 MG tablet Take 1 tablet (50 mg total) by mouth every 6 (six) hours as needed for severe pain.   acetaminophen (TYLENOL) 500 MG tablet Take 500 mg by mouth every 6 (six) hours as needed for mild pain. (Patient not taking: Reported on 09/03/2022)   bismuth subsalicylate (PEPTO BISMOL) 262 MG/15ML suspension Take 30 mLs by mouth every 6 (six) hours as needed for indigestion. (Patient not taking: Reported on 09/03/2022)   ibuprofen (ADVIL) 200 MG tablet Take 3 tablets (600 mg total) by mouth every 6 (six) hours as needed for moderate pain. (Patient not taking: Reported on 09/03/2022)   polyethylene glycol (MIRALAX) 17 g packet Take 17 g by mouth daily as needed for mild constipation. (Patient not taking: Reported on 09/03/2022)   No facility-administered encounter medications on file as of 09/03/2022.   No Known Allergies Patient Active Problem List   Diagnosis Date Noted   Acute perforated appendicitis 06/25/2022   S/P appendectomy 06/25/2022   Social History   Socioeconomic History   Marital status: Single    Spouse name: Not on file   Number of children: Not on file   Years of education: Not on file   Highest education level: Not on file  Occupational History   Not on file  Tobacco Use   Smoking status: Never   Smokeless tobacco: Never  Vaping Use   Vaping Use: Never used  Substance and Sexual Activity   Alcohol use: No   Drug use: No  Sexual activity: Not on file  Other Topics Concern   Not on file  Social History Narrative   Not on file   Social Determinants of Health   Financial Resource Strain: Not on file  Food Insecurity: Not on file  Transportation Needs: Not on file  Physical Activity: Not on file  Stress: Not on file  Social Connections: Not on file  Intimate Partner Violence: Not on file    Glen Sullivan family history includes Diabetes in his father and another family member; Hypertension in an other family member.      Objective:    Vitals:    09/03/22 0828  BP: (!) 142/68  Pulse: 63    Physical Exam Well-developed well-nourished f young Hispanic male in no acute distress.  Height, Weight, 224 BMI 33.1  HEENT; nontraumatic normocephalic, EOMI, PE R LA, sclera anicteric. Oropharynx; not examined today Neck; supple, no JVD Cardiovascular; regular rate and rhythm with S1-S2, no murmur rub or gallop Pulmonary; Clear bilaterally Abdomen; soft, nontender, nondistended, no palpable mass or hepatosplenomegaly, bowel sounds are active, laparoscopic incisional ports well-healed Rectal; not done today Skin; benign exam, no jaundice rash or appreciable lesions Extremities; no clubbing cyanosis or edema skin warm and dry Neuro/Psych; alert and oriented x4, grossly nonfocal mood and affect appropriate        Assessment & Plan:   #43 21 year old male status postacute perforated appendicitis with abscess 06/25/2022 Surgical path revealed a 0.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 Resected margin negative and 1 lymph node negative.  Chromogranin A within normal limits October 2023  Patient has recovered postoperatively and feels well  Plan; Based on guidelines, given the small size of this tumor at 0.5 cm, he does not need further resection, and appendectomy is curative.  Follow-up CT or MRI of the abdomen and pelvis can be considered 3 to 12 months postoperatively, though appendectomy is treatment of choice and curative.  Will review with Dr. Loletha Carrow with whom he will be established-regarding consideration of repeat imaging at 3 months versus 1 year.   Jamariyah Johannsen S Ziah Leandro PA-C 09/03/2022   Cc: Stark Klein, MD

## 2022-09-03 NOTE — Patient Instructions (Addendum)
Please follow up with Dr. Loletha Carrow or Edward Jolly, [place] as needed. Call us with any questions or concerns.  _______________________________________________________  If you are age 23 or older, your body mass index should be between 23-30. Your Body mass index is 33.1 kg/m. If this is out of the aforementioned range listed, please consider follow up with your Primary Care Provider.  If you are age 83 or younger, your body mass index should be between 19-25. Your Body mass index is 33.1 kg/m. If this is out of the aformentioned range listed, please consider follow up with your Primary Care Provider.   ________________________________________________________  The [facility] providers would like to encourage you to use [facility] to communicate with providers for non-urgent requests or questions.  Due to long hold times on the telephone, sending your provider a message by [facility] may be a faster and more efficient way to get a response.  Please allow 48 business hours for a response.  Please remember that this is for non-urgent requests.  _______________________________________________________  Due to recent changes in healthcare laws, you may see the results of your imaging and laboratory studies on MyChart before your provider has had a chance to review them.  We understand that in some cases there may be results that are confusing or concerning to you. Not all laboratory results come back in the same time frame and the provider may be waiting for multiple results in order to interpret others.  Please give [place] 48 hours in order for your provider to thoroughly review all the results before contacting the office for clarification of your results.    Thank you for choosing me and [facility].  Edward Jolly, PA-C

## 2022-09-05 NOTE — Progress Notes (Signed)
____________________________________________________________  Attending physician addendum:  Thank you for sending this case to me. I have reviewed the entire note as well as the pathology report, CT scan report and current guidelines.  Current guidelines indicate this patient does not need routine radiographic surveillance.  Wilfrid Lund, MD  ____________________________________________________________

## 2022-09-07 ENCOUNTER — Ambulatory Visit (INDEPENDENT_AMBULATORY_CARE_PROVIDER_SITE_OTHER): Payer: BLUE CROSS/BLUE SHIELD | Admitting: Surgery

## 2022-09-07 ENCOUNTER — Ambulatory Visit: Payer: Self-pay

## 2022-09-07 ENCOUNTER — Encounter: Payer: Self-pay | Admitting: Surgery

## 2022-09-07 DIAGNOSIS — M79661 Pain in right lower leg: Secondary | ICD-10-CM

## 2022-09-07 DIAGNOSIS — M5416 Radiculopathy, lumbar region: Secondary | ICD-10-CM

## 2022-09-07 NOTE — Progress Notes (Signed)
Office Visit Note   Patient: Glen Sullivan           Date of Birth: 10/30/2000           MRN: [id number] Visit Date: 09/07/2022              Requested by: Joycelyn Man, [address],  [place] [id number] PCP: Joycelyn Man, FNP   Assessment & Plan: Visit Diagnoses:  1. Pain in right lower leg   2. Radiculopathy, lumbar region     Plan: At this point recommend conservative treatment.  I asked patient to contact his gastroenterologist to just make sure that it is okay for him to use oral NSAIDs.  I will also schedule formal PT.  Follow-up in 4 weeks for recheck with Dr. Laurance Flatten.  If he continues have ongoing symptoms he may need lumbar MRI.  Follow-Up Instructions: Return in about 4 weeks (around 10/05/2022) for WITH DR Laurance Flatten FOR RECHECK CHRONIC BACK PAIN AND RIGHT LE RADICULOPATHY.   Orders:  Orders Placed This Encounter  Procedures   XR Lumbar Spine Complete   Ambulatory referral to Physical Therapy   No orders of the defined types were placed in this encounter.     Procedures: No procedures performed   Clinical Data: No additional findings.   Subjective: Chief Complaint  Patient presents with   Right Leg - Pain    HPI 21 year old Spanish male who is new patient to clinic was referred over for chronic low back pain and right lower extremity radiculopathy.  Patient states at the age of 44 he had the same issue and lumbar spine MRI was ordered.  Says that he was told that he had a "pinched nerve".  I do not have access to that MRI report.  He did go through formal PT which did give some improvement of his symptoms.  States that when he has been up all day at work he does get some pain into his right calf.  No left leg symptoms.  He is not currently taking any oral NSAIDs.  Patient recently had general surgery with Dr. Barry Dienes June 23, 2022 for perforated appendicitis with abscess.  Surgical pathology revealed neuroendocrine (carcinoid) tumor.  He  was referred to gastroenterologist.  States that he was told by GI that he did not need to follow-up with them.  States that he has not been told that he cannot use oral NSAIDs. Review of Systems No current complaints of cardiopulmonary GI/GU issues  Objective: Vital Signs: There were no vitals taken for this visit.  Physical Exam HENT:     Head: Normocephalic.     Nose: Nose normal.  Eyes:     Extraocular Movements: Extraocular movements intact.  Pulmonary:     Effort: No respiratory distress.  Musculoskeletal:     Comments: Gait is normal.  Good lumbar flexion extension without discomfort.  No sciatic notch tenderness.  No lumbar paraspinal tenderness.  Negative logroll bilateral hips.  Negative straight leg raise.  He does have tight hamstrings bilaterally.  No focal motor deficits.  Bilateral knee exam unremarkable.  Neurological:     Mental Status: He is alert and oriented to person, place, and time.  Psychiatric:        Mood and Affect: Mood normal.     Ortho Exam  Specialty Comments:  No specialty comments available.  Imaging: No results found.   PMFS History: Patient Active Problem List   Diagnosis Date Noted  Acute perforated appendicitis 06/25/2022   S/P appendectomy 06/25/2022   Past Medical History:  Diagnosis Date   H/O metal removed from eye    History of broken finger    4th finger left hand   Pinched nerve     Family History  Problem Relation Age of Onset   Diabetes Father    Diabetes Other    Hypertension Other     Past Surgical History:  Procedure Laterality Date   LAPAROSCOPIC APPENDECTOMY N/A 06/25/2022   Procedure: APPENDECTOMY LAPAROSCOPIC;  Surgeon: Stark Klein, MD;  Location: [facility];  Service: General;  Laterality: N/A;   Social History   Occupational History   Not on file  Tobacco Use   Smoking status: Never   Smokeless tobacco: Never  Vaping Use   Vaping Use: Never used  Substance and Sexual Activity   Alcohol use: No    Drug use: No   Sexual activity: Not on file

## 2022-09-11 ENCOUNTER — Telehealth: Payer: Self-pay

## 2022-09-11 NOTE — Telephone Encounter (Signed)
Attempted to contact the patient. No answer. No voicemail.

## 2022-09-11 NOTE — Telephone Encounter (Signed)
-----   Message from Alfredia Ferguson, PA-C sent at 09/05/2022 11:08 AM EST ----- Regarding: follow up Glen Sullivan - I saw this Glen Sullivan earlier this week - please call him and let him know Dr Loletha Carrow reviewed his path from surgery etc, and again according to current guidelines Glen Sullivan does not need any follow up CT or MRI - surgery curative - good news - he can still plan to see Dr Barry Dienes the surgeon back in one year from surgery

## 2022-09-16 NOTE — Therapy (Unsigned)
OUTPATIENT PHYSICAL THERAPY THORACOLUMBAR EVALUATION   Patient Name: Glen Sullivan MRN: [id number] DOB:[birth year], 21 y.o., male Today's Date: 09/17/2022  END OF SESSION:  PT End of Session - 09/17/22 0818     Visit Number 1    Number of Visits 4    Date for PT Re-Evaluation 11/12/22    Authorization Type [place] [facility]    PT Start Time 0745    PT Stop Time 0830    PT Time Calculation (min) 45 min    Activity Tolerance Patient tolerated treatment well    Behavior During Therapy WFL for tasks assessed/performed             Past Medical History:  Diagnosis Date   H/O metal removed from eye    History of broken finger    4th finger left hand   Pinched nerve    Past Surgical History:  Procedure Laterality Date   LAPAROSCOPIC APPENDECTOMY N/A 06/25/2022   Procedure: APPENDECTOMY LAPAROSCOPIC;  Surgeon: Stark Klein, MD;  Location: [facility];  Service: General;  Laterality: N/A;   Patient Active Problem List   Diagnosis Date Noted   Acute perforated appendicitis 06/25/2022   S/P appendectomy 06/25/2022    PCP: Joycelyn Man, FNP   REFERRING PROVIDER: Lanae Crumbly, PA-C   REFERRING DIAG: DIAGNOSIS: CHRONIC BACK PAIN AND INTERMITTENT RIGHT LE RADICULOPATHY SINCE AGE OF 14. EVALUATE AND TREAT AS NEEDED.  Rationale for Evaluation and Treatment: Rehabilitation  THERAPY DIAG: Visit Diagnoses:  1. Pain in right lower leg   2. Radiculopathy, lumbar region         ONSET DATE: chronic  SUBJECTIVE:                                                                                                                                                                                           SUBJECTIVE STATEMENT: R calf pain with prolonged standing, denies any low back pain.      PERTINENT HISTORY:  HPI 21 year old Spanish male who is new patient to clinic was referred over for chronic low back pain and right lower extremity radiculopathy.  Patient states at the age of 89 he  had the same issue and lumbar spine MRI was ordered.  Says that he was told that he had a "pinched nerve".  I do not have access to that MRI report.  He did go through formal PT which did give some improvement of his symptoms.  States that when he has been up all day at work he does get some pain into his right calf.  No left leg symptoms.  He is not currently taking any  oral NSAIDs.  Patient recently had general surgery with Dr. Barry Dienes June 23, 2022 for perforated appendicitis with abscess.  Surgical pathology revealed neuroendocrine (carcinoid) tumor.  He was referred to gastroenterologist.  States that he was told by GI that he did not need to follow-up with them.  States that he has not been told that he cannot use oral NSAIDs. Review of Systems No current complaints of cardiopulmonary GI/GU issues  PAIN:  Are you having pain? Yes: NPRS scale: 4/10 Pain location: R calf Pain description: ache Aggravating factors: prolonged standing Relieving factors: position changes  PRECAUTIONS: None  WEIGHT BEARING RESTRICTIONS: No  FALLS:  Has patient fallen in last 6 months? No  LIVING ENVIRONMENT: Lives with: lives with their family  OCCUPATION: welder  PLOF: Independent  PATIENT GOALS: to reduce and manage my symptoms  NEXT MD VISIT:   OBJECTIVE:   DIAGNOSTIC FINDINGS:  None available  PATIENT SURVEYS:  FOTO 94(91 predicted)  SCREENING FOR RED FLAGS:  negative   COGNITION: Overall cognitive status: Within functional limits for tasks assessed     SENSATION: WFL  MUSCLE LENGTH: Hamstrings: Right 80 deg; Left 80 deg Thomas test: negative PKB B  POSTURE: rounded shoulders  PALPATION: TTP lateral R gastroc, TP detected   LUMBAR ROM:   AROM eval  Flexion 100%  Extension 100%  Right lateral flexion 100%  Left lateral flexion 100%  Right rotation   Left rotation    (Blank rows = not tested)  LOWER EXTREMITY ROM:   WNL throughout  Active  Right eval  Left eval  Hip flexion    Hip extension    Hip abduction    Hip adduction    Hip internal rotation    Hip external rotation    Knee flexion    Knee extension    Ankle dorsiflexion    Ankle plantarflexion    Ankle inversion    Ankle eversion     (Blank rows = not tested)  LOWER EXTREMITY MMT:    MMT Right eval Left eval  Hip flexion    Hip extension    Hip abduction    Hip adduction    Hip internal rotation    Hip external rotation    Knee flexion    Knee extension    Ankle dorsiflexion    Ankle plantarflexion 5- 5  Ankle inversion    Ankle eversion     (Blank rows = not tested)  LUMBAR SPECIAL TESTS:  Straight leg raise test: Negative, Slump test: Negative, and Thomas test: Negative  FUNCTIONAL TESTS:  5 times sit to stand: 12s 30s SLS WNL B  GAIT: Distance walked: 34f x2 Assistive device utilized: None Level of assistance: Complete Independence Comments: unremarkable  TODAY'S TREATMENT:                                                                                                                              DATE: 09/17/22  PATIENT EDUCATION:  Education details: Discussed eval findings, rehab rationale and POC and patient is in agreement  Person educated: Patient Education method: Explanation Education comprehension: verbalized understanding and needs further education  HOME EXERCISE PROGRAM: Access Code: [email or web address] URL: [email or web address] Date: 09/17/2022 Prepared by: Sharlynn Oliphant  Exercises - Gastroc Stretch on Wall  - 2 x daily - 5 x weekly - 1 sets - 3 reps - 30s hold - Soleus Stretch on Wall  - 2 x daily - 5 x weekly - 1 sets - 3 reps - 30s hold - Single Leg Heel Raise  - 2 x daily - 5 x weekly - 2 sets - 15 reps - Long Sitting Plantar Fascia Stretch with Towel  - 2 x daily - 5 x weekly - 1 sets - 3 reps - 30s hold  ASSESSMENT:  CLINICAL IMPRESSION: Patient is a 21 y.o. male who was seen today for physical therapy  evaluation and treatment for R calf pain.   OBJECTIVE IMPAIRMENTS: decreased knowledge of condition, increased muscle spasms, and pain.   ACTIVITY LIMITATIONS: lifting and standing  PERSONAL FACTORS: Age, Fitness, and Time since onset of injury/illness/exacerbation are also affecting patient's functional outcome.   REHAB POTENTIAL: Good  CLINICAL DECISION MAKING: Stable/uncomplicated  EVALUATION COMPLEXITY: Low   GOALS: Goals reviewed with patient? No  SHORT TERM GOALS: Target date: 10/15/2022    Patient to demonstrate independence in HEP  Baseline: [id number] Goal status: INITIAL  2.  5/5 R PF strength  Baseline: 5-/5 R PF strength Goal status: INITIAL  3.  2/10 worst pain Baseline: 4/10 worst pain Goal status: INITIAL  4.  Decrease TTP R calf TP from moderate to minimal Baseline: Moderate tenderness Goal status: INITIAL     PLAN:  PT FREQUENCY: 1-2x/week  PT DURATION: 4 weeks  PLANNED INTERVENTIONS: Therapeutic exercises, Therapeutic activity, Neuromuscular re-education, Balance training, Gait training, Patient/Family education, Self Care, Joint mobilization, Stair training, Dry Needling, Spinal mobilization, Manual therapy, and Re-evaluation.  PLAN FOR NEXT SESSION: HEP review an update, ROM and stretching tasks, core and LE strengthening, aerobic tasks   Lanice Shirts, PT 09/17/2022, 8:19 AM   Check all possible CPT codes: [phone] - PT Re-evaluation, 97110- Therapeutic Exercise, [phone]- Neuro Re-education, [phone] - Gait Training, [phone] - Manual Therapy, [phone] - Therapeutic Activities, and [phone] - Self Care    Check all conditions that are expected to impact treatment: None of these apply   If treatment provided at initial evaluation, no treatment charged due to lack of authorization.

## 2022-09-17 ENCOUNTER — Ambulatory Visit: Payer: BLUE CROSS/BLUE SHIELD | Attending: Surgery

## 2022-09-17 ENCOUNTER — Other Ambulatory Visit: Payer: Self-pay

## 2022-09-17 DIAGNOSIS — M5416 Radiculopathy, lumbar region: Secondary | ICD-10-CM

## 2022-09-17 DIAGNOSIS — M6281 Muscle weakness (generalized): Secondary | ICD-10-CM | POA: Diagnosis present

## 2022-09-17 DIAGNOSIS — M5459 Other low back pain: Secondary | ICD-10-CM

## 2022-09-17 DIAGNOSIS — M79661 Pain in right lower leg: Secondary | ICD-10-CM | POA: Insufficient documentation

## 2022-09-25 ENCOUNTER — Other Ambulatory Visit: Payer: Self-pay

## 2022-09-25 NOTE — Telephone Encounter (Signed)
Letter mailed to the patient

## 2022-10-03 NOTE — Therapy (Signed)
OUTPATIENT PHYSICAL THERAPY TREATMENT NOTE   Patient Name: Glen Sullivan MRN: [id number] DOB:07-09-2001, 22 y.o., male Today's Date: 10/05/2022  PCP: Joycelyn Man, FNP   REFERRING PROVIDER: Lanae Crumbly, PA-C    END OF SESSION:   PT End of Session - 10/05/22 0748     Visit Number 2    Number of Visits 4    Date for PT Re-Evaluation 11/12/22    Authorization Type [place] MCD    PT Start Time 0746    PT Stop Time 0825    PT Time Calculation (min) 39 min    Activity Tolerance Patient tolerated treatment well    Behavior During Therapy WFL for tasks assessed/performed             Past Medical History:  Diagnosis Date   H/O metal removed from eye    History of broken finger    4th finger left hand   Pinched nerve    Past Surgical History:  Procedure Laterality Date   LAPAROSCOPIC APPENDECTOMY N/A 06/25/2022   Procedure: APPENDECTOMY LAPAROSCOPIC;  Surgeon: Stark Klein, MD;  Location: [facility];  Service: General;  Laterality: N/A;   Patient Active Problem List   Diagnosis Date Noted   Low back pain 10/04/2022   Acute perforated appendicitis 06/25/2022   S/P appendectomy 06/25/2022    REFERRING DIAG: DIAGNOSIS: CHRONIC BACK PAIN AND INTERMITTENT RIGHT LE RADICULOPATHY SINCE AGE OF 14   THERAPY DIAG:  1. Pain in right lower leg   2. Radiculopathy, lumbar region     Rationale for Evaluation and Treatment Rehabilitation  PERTINENT HISTORY: HPI 22 year old Spanish male who is new patient to clinic was referred over for chronic low back pain and right lower extremity radiculopathy.  Patient states at the age of 35 he had the same issue and lumbar spine MRI was ordered.  Says that he was told that he had a "pinched nerve".  I do not have access to that MRI report.  He did go through formal PT which did give some improvement of his symptoms.  States that when he has been up all day at work he does get some pain into his right calf.  No left leg symptoms.  He is not  currently taking any oral NSAIDs.  Patient recently had general surgery with Dr. Barry Dienes June 23, 2022 for perforated appendicitis with abscess.  Surgical pathology revealed neuroendocrine (carcinoid) tumor.  He was referred to gastroenterologist.  States that he was told by GI that he did not need to follow-up with them.  States that he has not been told that he cannot use oral NSAIDs. Review of Systems No current complaints of cardiopulmonary GI/GU issues  PRECAUTIONS: None   SUBJECTIVE:  SUBJECTIVE STATEMENT:  Reports symptoms much improved, only notes occasional R calf discomfort with strenuous activities   PAIN:  Are you having pain? Yes: NPRS scale: 3/10 Pain location: R calf Pain description: ache Aggravating factors: stretching Relieving factors: rest and stretch    OBJECTIVE: (objective measures completed at initial evaluation unless otherwise dated)   DIAGNOSTIC FINDINGS:  None available   PATIENT SURVEYS:  FOTO 94(91 predicted)   SCREENING FOR RED FLAGS:            negative     COGNITION: Overall cognitive status: Within functional limits for tasks assessed                          SENSATION: WFL   MUSCLE LENGTH: Hamstrings: Right 80 deg; Left 80 deg Thomas test: negative PKB B   POSTURE: rounded shoulders   PALPATION: TTP lateral R gastroc, TP detected     LUMBAR ROM:    AROM eval  Flexion 100%  Extension 100%  Right lateral flexion 100%  Left lateral flexion 100%  Right rotation    Left rotation     (Blank rows = not tested)   LOWER EXTREMITY ROM:   WNL throughout   Active  Right eval Left eval  Hip flexion      Hip extension      Hip abduction      Hip adduction      Hip internal rotation      Hip external rotation      Knee flexion      Knee extension       Ankle dorsiflexion      Ankle plantarflexion      Ankle inversion      Ankle eversion       (Blank rows = not tested)   LOWER EXTREMITY MMT:     MMT Right eval Left eval  Hip flexion      Hip extension      Hip abduction      Hip adduction      Hip internal rotation      Hip external rotation      Knee flexion      Knee extension      Ankle dorsiflexion      Ankle plantarflexion 5- 5  Ankle inversion      Ankle eversion       (Blank rows = not tested)   LUMBAR SPECIAL TESTS:  Straight leg raise test: Negative, Slump test: Negative, and Thomas test: Negative   FUNCTIONAL TESTS:  5 times sit to stand: 12s 30s SLS WNL B   GAIT: Distance walked: 59f x2 Assistive device utilized: None Level of assistance: Complete Independence Comments: unremarkable   TODAY'S TREATMENT:     [facility] Adult PT Treatment:                                                DATE: 10/05/21 Therapeutic Exercise: Nustep L4 8 min R hamstring stretch seated 30s x2 R PF stretch 30s x2 R piriformis stretch fig 4 w/towel Single leg bridge 15/15 90/90 30s x2 Slant board gastroc stretch 30s x2 Slant board soleus stretch 30s x2 Prone press 10x, then 10x with PT OP 4 in heel raises 1s conc/5s ecc 15x  DATE: 09/17/22 eval     PATIENT EDUCATION:  Education details: Discussed eval findings, rehab rationale and POC and patient is in agreement  Person educated: Patient Education method: Explanation Education comprehension: verbalized understanding and needs further education   HOME EXERCISE PROGRAM: Access Code: [email or web address] URL: [email or web address] Date: 09/17/2022 Prepared by: Sharlynn Oliphant   Exercises - Gastroc Stretch on Wall  - 2 x daily - 5 x weekly - 1 sets - 3 reps - 30s hold - Soleus Stretch on Wall  - 2 x daily - 5 x weekly - 1 sets - 3 reps - 30s hold -  Single Leg Heel Raise  - 2 x daily - 5 x weekly - 2 sets - 15 reps - Long Sitting Plantar Fascia Stretch with Towel  - 2 x daily - 5 x weekly - 1 sets - 3 reps - 30s hold   ASSESSMENT:   CLINICAL IMPRESSION: Symptoms greatly improved since last session, has been semi compliant with HEP over the holidays but stretching helps.  Focus today was HEP, stretching of LE's including hamstrings, piriformis, PF and gastroc/soleus region.  Added additional core strengthening tasks and concentric/eccentric  PF strengthening    OBJECTIVE IMPAIRMENTS: decreased knowledge of condition, increased muscle spasms, and pain.    ACTIVITY LIMITATIONS: lifting and standing   PERSONAL FACTORS: Age, Fitness, and Time since onset of injury/illness/exacerbation are also affecting patient's functional outcome.    REHAB POTENTIAL: Good   CLINICAL DECISION MAKING: Stable/uncomplicated   EVALUATION COMPLEXITY: Low     GOALS: Goals reviewed with patient? No   SHORT TERM GOALS: Target date: 10/15/2022     Patient to demonstrate independence in HEP  Baseline: [id number] Goal status: INITIAL   2.  5/5 R PF strength  Baseline: 5-/5 R PF strength Goal status: INITIAL   3.  2/10 worst pain Baseline: 4/10 worst pain Goal status: INITIAL   4.  Decrease TTP R calf TP from moderate to minimal Baseline: Moderate tenderness Goal status: INITIAL         PLAN:   PT FREQUENCY: 1-2x/week   PT DURATION: 4 weeks   PLANNED INTERVENTIONS: Therapeutic exercises, Therapeutic activity, Neuromuscular re-education, Balance training, Gait training, Patient/Family education, Self Care, Joint mobilization, Stair training, Dry Needling, Spinal mobilization, Manual therapy, and Re-evaluation.   PLAN FOR NEXT SESSION: HEP review an update, ROM and stretching tasks, core and LE strengthening, aerobic tasks, review lifting mechanics especially deadlift technique   Lanice Shirts, PT 10/05/2022, 8:29 AM

## 2022-10-04 ENCOUNTER — Ambulatory Visit (INDEPENDENT_AMBULATORY_CARE_PROVIDER_SITE_OTHER): Payer: Medicaid Other | Admitting: Physician Assistant

## 2022-10-04 ENCOUNTER — Encounter: Payer: Self-pay | Admitting: Physician Assistant

## 2022-10-04 DIAGNOSIS — M545 Low back pain, unspecified: Secondary | ICD-10-CM | POA: Insufficient documentation

## 2022-10-04 DIAGNOSIS — M5440 Lumbago with sciatica, unspecified side: Secondary | ICD-10-CM

## 2022-10-04 NOTE — Progress Notes (Signed)
Office Visit Note   Patient: Glen Sullivan           Date of Birth: 04-20-2001           MRN: [id number] Visit Date: 10/04/2022              Requested by: Joycelyn Man, [address] [place],  [place] [address] PCP: Joycelyn Man, FNP  Chief Complaint  Patient presents with   Lower Back - Follow-up      HPI: Glen Sullivan is a pleasant 22 year old gentleman who is in follow-up today.  He was a patient of Benjiman Core.  He has been followed for right lower extremity radicular pain.  He describes the pain as shooting down his left leg especially into his calf.  He had this as a teenager and an MRI per his report said that he had a "pinched nerve "he did physical therapy at that time and became asymptomatic.  Recently he had the return of symptoms no particular injury.  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  He denies any fever, chills, shortness of breath, swelling in his leg.  He denies any weakness loss of bowel or bladder control.  He has been doing physical therapy and is attended 1 session with 4 more to go.  He has been doing his home exercises.  He admits he is doing much better  Assessment & Plan: Visit Diagnoses:  1. Acute right-sided low back pain with sciatica, sciatica laterality unspecified     Plan: History of right radicular pain running down to his right calf.  He is neurovascularly intact his strength is good and he is significantly improved with just minimal physical therapy.  This is the same per the patient as he had in the past and has had an MRI that demonstrated "a pinched nerve "though I do not have access to this MRI.  I think it is reasonable for him to continue to work with physical therapy if he has any exacerbation of his symptoms or any other weakness paresthesias he will contact me and I will order an MRI.  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  But then he could certainly have an MRI and  follow-up with Dr. Laurance Flatten  Follow-Up Instructions: No follow-ups on file.   Ortho Exam  Patient is alert, oriented, no adenopathy, well-dressed, normal affect, normal respiratory effort. Examination of his lower back he is neurovascularly intact strength is 5 out of 5 with resisted dorsiflexion plantarflexion extension and flexion of his legs.  He has a negative straight leg raise.  There is no swelling in his calf negative Bevelyn Buckles' sign no tenderness to his back itself  Imaging: No results found. No images are attached to the encounter.  Labs: No results found for: "HGBA1C", "ESRSEDRATE", "CRP", "LABURIC", "REPTSTATUS", "GRAMSTAIN", "CULT", "LABORGA"   Lab Results  Component Value Date   ALBUMIN 4.8 06/25/2022    No results found for: "MG" No results found for: "VD25OH"  No results found for: "PREALBUMIN"    Latest Ref Rng & Units 06/26/2022    4:53 AM 06/25/2022    3:04 PM  CBC EXTENDED  WBC 4.0 - 10.5 K/uL 12.7  12.6   RBC 4.22 - 5.81 MIL/uL 4.57  4.84   Hemoglobin 13.0 - 17.0 g/dL 13.9  14.9   HCT 39.0 - 52.0 % 40.0  42.4   Platelets 150 - 400 K/uL  168  196      There is no height or weight on file to calculate BMI.  Orders:  No orders of the defined types were placed in this encounter.  No orders of the defined types were placed in this encounter.    Procedures: No procedures performed  Clinical Data: No additional findings.  ROS:  All other systems negative, except as noted in the HPI. Review of Systems  All other systems reviewed and are negative.   Objective: Vital Signs: There were no vitals taken for this visit.  Specialty Comments:  No specialty comments available.  PMFS History: Patient Active Problem List   Diagnosis Date Noted   Low back pain 10/04/2022   Acute perforated appendicitis 06/25/2022   S/P appendectomy 06/25/2022   Past Medical History:  Diagnosis Date   H/O metal removed from eye    History of broken finger    4th  finger left hand   Pinched nerve     Family History  Problem Relation Age of Onset   Diabetes Father    Diabetes Other    Hypertension Other     Past Surgical History:  Procedure Laterality Date   LAPAROSCOPIC APPENDECTOMY N/A 06/25/2022   Procedure: APPENDECTOMY LAPAROSCOPIC;  Surgeon: Stark Klein, MD;  Location: [facility];  Service: General;  Laterality: N/A;   Social History   Occupational History   Not on file  Tobacco Use   Smoking status: Never   Smokeless tobacco: Never  Vaping Use   Vaping Use: Never used  Substance and Sexual Activity   Alcohol use: No   Drug use: No   Sexual activity: Not on file

## 2022-10-05 ENCOUNTER — Ambulatory Visit: Payer: Medicaid Other | Attending: Surgery

## 2022-10-05 DIAGNOSIS — M5459 Other low back pain: Secondary | ICD-10-CM | POA: Diagnosis present

## 2022-10-05 DIAGNOSIS — M6281 Muscle weakness (generalized): Secondary | ICD-10-CM

## 2022-10-05 DIAGNOSIS — M5416 Radiculopathy, lumbar region: Secondary | ICD-10-CM | POA: Insufficient documentation

## 2022-10-13 ENCOUNTER — Ambulatory Visit: Payer: Medicaid Other

## 2022-10-13 DIAGNOSIS — M5416 Radiculopathy, lumbar region: Secondary | ICD-10-CM | POA: Diagnosis not present

## 2022-10-13 DIAGNOSIS — M6281 Muscle weakness (generalized): Secondary | ICD-10-CM

## 2022-10-13 DIAGNOSIS — M5459 Other low back pain: Secondary | ICD-10-CM

## 2022-10-13 NOTE — Therapy (Signed)
OUTPATIENT PHYSICAL THERAPY TREATMENT NOTE   Patient Name: Glen Sullivan MRN: [id number] DOB:[birth year], 22 y.o., male Today's Date: 10/13/2022  PCP: Joycelyn Man, FNP   REFERRING PROVIDER: Lanae Crumbly, PA-C    END OF SESSION:   PT End of Session - 10/13/22 0809     Visit Number 3    Number of Visits 4    Date for PT Re-Evaluation 11/12/22    Authorization Type [place] [facility]    PT Start Time 0815    PT Stop Time 0855    PT Time Calculation (min) 40 min    Activity Tolerance Patient tolerated treatment well    Behavior During Therapy WFL for tasks assessed/performed              Past Medical History:  Diagnosis Date   H/O metal removed from eye    History of broken finger    4th finger left hand   Pinched nerve    Past Surgical History:  Procedure Laterality Date   LAPAROSCOPIC APPENDECTOMY N/A 06/25/2022   Procedure: APPENDECTOMY LAPAROSCOPIC;  Surgeon: Stark Klein, MD;  Location: [facility];  Service: General;  Laterality: N/A;   Patient Active Problem List   Diagnosis Date Noted   Low back pain 10/04/2022   Acute perforated appendicitis 06/25/2022   S/P appendectomy 06/25/2022    REFERRING DIAG: DIAGNOSIS: CHRONIC BACK PAIN AND INTERMITTENT RIGHT LE RADICULOPATHY SINCE AGE OF 14   THERAPY DIAG:  1. Pain in right lower leg   2. Radiculopathy, lumbar region     Rationale for Evaluation and Treatment Rehabilitation  PERTINENT HISTORY: HPI 22 year old Spanish male who is new patient to clinic was referred over for chronic low back pain and right lower extremity radiculopathy.  Patient states at the age of 59 he had the same issue and lumbar spine MRI was ordered.  Says that he was told that he had a "pinched nerve".  I do not have access to that MRI report.  He did go through formal PT which did give some improvement of his symptoms.  States that when he has been up all day at work he does get some pain into his right calf.  No left leg symptoms.  He is not  currently taking any oral NSAIDs.  Patient recently had general surgery with Dr. Barry Dienes June 23, 2022 for perforated appendicitis with abscess.  Surgical pathology revealed neuroendocrine (carcinoid) tumor.  He was referred to gastroenterologist.  States that he was told by GI that he did not need to follow-up with them.  States that he has not been told that he cannot use oral NSAIDs. Review of Systems No current complaints of cardiopulmonary GI/GU issues  PRECAUTIONS: None   SUBJECTIVE:  SUBJECTIVE STATEMENT:  Patient reports no current LBP and reports HEP compliance. He states his R calf was bothering him some yesterday.    PAIN:  Are you having pain? Yes: NPRS scale: 0/10 Pain location: R calf Pain description: ache Aggravating factors: stretching Relieving factors: rest and stretch    OBJECTIVE: (objective measures completed at initial evaluation unless otherwise dated)   DIAGNOSTIC FINDINGS:  None available   PATIENT SURVEYS:  FOTO 94(91 predicted)   SCREENING FOR RED FLAGS:            negative     COGNITION: Overall cognitive status: Within functional limits for tasks assessed                          SENSATION: WFL   MUSCLE LENGTH: Hamstrings: Right 80 deg; Left 80 deg Thomas test: negative PKB B   POSTURE: rounded shoulders   PALPATION: TTP lateral R gastroc, TP detected     LUMBAR ROM:    AROM eval  Flexion 100%  Extension 100%  Right lateral flexion 100%  Left lateral flexion 100%  Right rotation    Left rotation     (Blank rows = not tested)   LOWER EXTREMITY ROM:   WNL throughout   Active  Right eval Left eval  Hip flexion      Hip extension      Hip abduction      Hip adduction      Hip internal rotation      Hip external rotation      Knee flexion       Knee extension      Ankle dorsiflexion      Ankle plantarflexion      Ankle inversion      Ankle eversion       (Blank rows = not tested)   LOWER EXTREMITY MMT:     MMT Right eval Left eval  Hip flexion      Hip extension      Hip abduction      Hip adduction      Hip internal rotation      Hip external rotation      Knee flexion      Knee extension      Ankle dorsiflexion      Ankle plantarflexion 5- 5  Ankle inversion      Ankle eversion       (Blank rows = not tested)   LUMBAR SPECIAL TESTS:  Straight leg raise test: Negative, Slump test: Negative, and Thomas test: Negative   FUNCTIONAL TESTS:  5 times sit to stand: 12s 30s SLS WNL B   GAIT: Distance walked: 42f x2 Assistive device utilized: None Level of assistance: Complete Independence Comments: unremarkable   TODAY'S TREATMENT:     [facility] Adult PT Treatment:                                                DATE: 10/13/2022 Therapeutic Exercise: Nustep level 5 x 5 mins while gathering subjective R hamstring stretch 30s BIL Single leg bridge 15/15 90/90 30s x2 90/90 with alternating heel taps 2x10 BIL Dead bugs? 2x10 BIL Bird dogs x10 BIL Prone press ups x10 Slant board gastroc stretch x2' 4 in heel raises 1s conc/5s ecc 2x15 Deadlift from 8" box 15# KB  2x10 - cues for form  [facility] Adult PT Treatment:                                                DATE: 10/05/21 Therapeutic Exercise: Nustep L4 8 min R hamstring stretch seated 30s x2 R PF stretch 30s x2 R piriformis stretch fig 4 w/towel Single leg bridge 15/15 90/90 30s x2 Slant board gastroc stretch 30s x2 Slant board soleus stretch 30s x2 Prone press 10x, then 10x with PT OP 4 in heel raises 1s conc/5s ecc 15x                                                                                                                          DATE: 09/17/22 eval     PATIENT EDUCATION:  Education details: Discussed eval findings, rehab rationale and POC and  patient is in agreement  Person educated: Patient Education method: Explanation Education comprehension: verbalized understanding and needs further education   HOME EXERCISE PROGRAM: Access Code: [email or web address] URL: [email or web address] Date: 09/17/2022 Prepared by: Sharlynn Oliphant   Exercises - Gastroc Stretch on Wall  - 2 x daily - 5 x weekly - 1 sets - 3 reps - 30s hold - Soleus Stretch on Wall  - 2 x daily - 5 x weekly - 1 sets - 3 reps - 30s hold - Single Leg Heel Raise  - 2 x daily - 5 x weekly - 2 sets - 15 reps - Long Sitting Plantar Fascia Stretch with Towel  - 2 x daily - 5 x weekly - 1 sets - 3 reps - 30s hold   ASSESSMENT:   CLINICAL IMPRESSION:  Patient presents to PT with no current pain in his back and reports mild Rt calf irritation yesterday. Session today focused on neutral spine strengthening and stretching for BIL calf muscles and hamstrings. Increase resistance as noted to good affect, no increase in pain. Patient was able to tolerate all prescribed exercises with no adverse effects. Patient continues to benefit from skilled PT services and should be progressed as able to improve functional independence.     OBJECTIVE IMPAIRMENTS: decreased knowledge of condition, increased muscle spasms, and pain.    ACTIVITY LIMITATIONS: lifting and standing   PERSONAL FACTORS: Age, Fitness, and Time since onset of injury/illness/exacerbation are also affecting patient's functional outcome.    REHAB POTENTIAL: Good   CLINICAL DECISION MAKING: Stable/uncomplicated   EVALUATION COMPLEXITY: Low     GOALS: Goals reviewed with patient? No   SHORT TERM GOALS: Target date: 10/15/2022     Patient to demonstrate independence in HEP  Baseline: [id number] Goal status: INITIAL   2.  5/5 R PF strength  Baseline: 5-/5 R PF strength Goal status: INITIAL   3.  2/10 worst pain Baseline: 4/10 worst pain Goal status: INITIAL   4.  Decrease TTP  R calf TP from moderate to  minimal Baseline: Moderate tenderness Goal status: INITIAL         PLAN:   PT FREQUENCY: 1-2x/week   PT DURATION: 4 weeks   PLANNED INTERVENTIONS: Therapeutic exercises, Therapeutic activity, Neuromuscular re-education, Balance training, Gait training, Patient/Family education, Self Care, Joint mobilization, Stair training, Dry Needling, Spinal mobilization, Manual therapy, and Re-evaluation.   PLAN FOR NEXT SESSION: HEP review an update, ROM and stretching tasks, core and LE strengthening, aerobic tasks, review lifting mechanics especially deadlift technique   Margarette Canada, PTA 10/13/2022, 8:53 AM

## 2022-10-18 NOTE — Therapy (Signed)
OUTPATIENT PHYSICAL THERAPY TREATMENT NOTE   Patient Name: Nichols Corter MRN: [id number] DOB:09/27/2001, 22 y.o., male Today's Date: 10/20/2022  PCP: Joycelyn Man, FNP   REFERRING PROVIDER: Lanae Crumbly, PA-C    END OF SESSION:   PT End of Session - 10/20/22 0806     Visit Number 4    Date for PT Re-Evaluation 11/12/22    Authorization Type [place] [facility]    PT Start Time 0815    PT Stop Time 0855    PT Time Calculation (min) 40 min    Activity Tolerance Patient tolerated treatment well    Behavior During Therapy WFL for tasks assessed/performed               Past Medical History:  Diagnosis Date   H/O metal removed from eye    History of broken finger    4th finger left hand   Pinched nerve    Past Surgical History:  Procedure Laterality Date   LAPAROSCOPIC APPENDECTOMY N/A 06/25/2022   Procedure: APPENDECTOMY LAPAROSCOPIC;  Surgeon: Stark Klein, MD;  Location: [facility];  Service: General;  Laterality: N/A;   Patient Active Problem List   Diagnosis Date Noted   Low back pain 10/04/2022   Acute perforated appendicitis 06/25/2022   S/P appendectomy 06/25/2022    REFERRING DIAG: DIAGNOSIS: CHRONIC BACK PAIN AND INTERMITTENT RIGHT LE RADICULOPATHY SINCE AGE OF 14   THERAPY DIAG:  1. Pain in right lower leg   2. Radiculopathy, lumbar region     Rationale for Evaluation and Treatment Rehabilitation  PERTINENT HISTORY: HPI 22 year old Spanish male who is new patient to clinic was referred over for chronic low back pain and right lower extremity radiculopathy.  Patient states at the age of 72 he had the same issue and lumbar spine MRI was ordered.  Says that he was told that he had a "pinched nerve".  I do not have access to that MRI report.  He did go through formal PT which did give some improvement of his symptoms.  States that when he has been up all day at work he does get some pain into his right calf.  No left leg symptoms.  He is not currently taking any  oral NSAIDs.  Patient recently had general surgery with Dr. Barry Dienes June 23, 2022 for perforated appendicitis with abscess.  Surgical pathology revealed neuroendocrine (carcinoid) tumor.  He was referred to gastroenterologist.  States that he was told by GI that he did not need to follow-up with them.  States that he has not been told that he cannot use oral NSAIDs. Review of Systems No current complaints of cardiopulmonary GI/GU issues  PRECAUTIONS: None   SUBJECTIVE:  SUBJECTIVE STATEMENT: Patient reports his R calf is not hurting today and that his back was hurting earlier this week when he had to do some bending.   PAIN:  Are you having pain? Yes: NPRS scale: 0/10 Pain location: R calf Pain description: ache Aggravating factors: stretching Relieving factors: rest and stretch    OBJECTIVE: (objective measures completed at initial evaluation unless otherwise dated)   DIAGNOSTIC FINDINGS:  None available   PATIENT SURVEYS:  FOTO 94(91 predicted)   SCREENING FOR RED FLAGS:            negative     COGNITION: Overall cognitive status: Within functional limits for tasks assessed                          SENSATION: WFL   MUSCLE LENGTH: Hamstrings: Right 80 deg; Left 80 deg Thomas test: negative PKB B   POSTURE: rounded shoulders   PALPATION: TTP lateral R gastroc, TP detected     LUMBAR ROM:    AROM eval  Flexion 100%  Extension 100%  Right lateral flexion 100%  Left lateral flexion 100%  Right rotation    Left rotation     (Blank rows = not tested)   LOWER EXTREMITY ROM:   WNL throughout   Active  Right eval Left eval  Hip flexion      Hip extension      Hip abduction      Hip adduction      Hip internal rotation      Hip external rotation      Knee flexion      Knee  extension      Ankle dorsiflexion      Ankle plantarflexion      Ankle inversion      Ankle eversion       (Blank rows = not tested)   LOWER EXTREMITY MMT:     MMT Right eval Left eval  Hip flexion      Hip extension      Hip abduction      Hip adduction      Hip internal rotation      Hip external rotation      Knee flexion      Knee extension      Ankle dorsiflexion      Ankle plantarflexion 5- 5  Ankle inversion      Ankle eversion       (Blank rows = not tested)   LUMBAR SPECIAL TESTS:  Straight leg raise test: Negative, Slump test: Negative, and Thomas test: Negative   FUNCTIONAL TESTS:  5 times sit to stand: 12s 30s SLS WNL B   GAIT: Distance walked: 90f x2 Assistive device utilized: None Level of assistance: Complete Independence Comments: unremarkable   TODAY'S TREATMENT:     [facility] Adult PT Treatment:                                                DATE: 10/20/2022 Therapeutic Exercise: Bike level 4 x 5 mins Palloff press 13# 2x10 BIL Chops 13# x10 BIL Reverse chops 7# x10 BIL hamstring stretch x1' BIL Single leg bridge 15/15 90/90 30s x2 Dead bugs 2x10 BIL Prone press ups x10 Slant board gastroc stretch x2' 4 in heel raises 1s conc/5s ecc 2x15 Seated soleus heel  raises 2" step 25# KB 2x15 BIL  [facility] Adult PT Treatment:                                                DATE: 10/13/2022 Therapeutic Exercise: Nustep level 5 x 5 mins while gathering subjective R hamstring stretch 30s BIL Single leg bridge 15/15 90/90 30s x2 90/90 with alternating heel taps 2x10 BIL Dead bugs 2x10 BIL Bird dogs x10 BIL Prone press ups x10 Slant board gastroc stretch x2' 4 in heel raises 1s conc/5s ecc 2x15 Deadlift from 8" box 15# KB 2x10 - cues for form  [facility] Adult PT Treatment:                                                DATE: 10/05/21 Therapeutic Exercise: Nustep L4 8 min R hamstring stretch seated 30s x2 R PF stretch 30s x2 R piriformis stretch fig 4  w/towel Single leg bridge 15/15 90/90 30s x2 Slant board gastroc stretch 30s x2 Slant board soleus stretch 30s x2 Prone press 10x, then 10x with PT OP 4 in heel raises 1s conc/5s ecc 15x                                                                                                                            PATIENT EDUCATION:  Education details: Discussed eval findings, rehab rationale and POC and patient is in agreement  Person educated: Patient Education method: Explanation Education comprehension: verbalized understanding and needs further education   HOME EXERCISE PROGRAM: Access Code: [email or web address] URL: [email or web address] Date: 09/17/2022 Prepared by: Sharlynn Oliphant   Exercises - Gastroc Stretch on Wall  - 2 x daily - 5 x weekly - 1 sets - 3 reps - 30s hold - Soleus Stretch on Wall  - 2 x daily - 5 x weekly - 1 sets - 3 reps - 30s hold - Single Leg Heel Raise  - 2 x daily - 5 x weekly - 2 sets - 15 reps - Long Sitting Plantar Fascia Stretch with Towel  - 2 x daily - 5 x weekly - 1 sets - 3 reps - 30s hold   ASSESSMENT:   CLINICAL IMPRESSION:  Patient presents to PT reporting no current pain in his lower back or calf and states his back was bothering him earlier this week when he was having to crouch down at work. Session today focused on core and calf strengthening as well as stretching for calf muscles. Increased difficulty of core exercises today to good affect, no increase in pain throughout session. Patient was able to tolerate all prescribed exercises with no adverse effects. Patient continues to benefit from skilled PT services and should be  progressed as able to improve functional independence.     OBJECTIVE IMPAIRMENTS: decreased knowledge of condition, increased muscle spasms, and pain.    ACTIVITY LIMITATIONS: lifting and standing   PERSONAL FACTORS: Age, Fitness, and Time since onset of injury/illness/exacerbation are also affecting patient's  functional outcome.    REHAB POTENTIAL: Good   CLINICAL DECISION MAKING: Stable/uncomplicated   EVALUATION COMPLEXITY: Low     GOALS: Goals reviewed with patient? No   SHORT TERM GOALS: Target date: 10/15/2022     Patient to demonstrate independence in HEP  Baseline: [id number] Goal status: INITIAL   2.  5/5 R PF strength  Baseline: 5-/5 R PF strength Goal status: INITIAL   3.  2/10 worst pain Baseline: 4/10 worst pain Goal status: INITIAL   4.  Decrease TTP R calf TP from moderate to minimal Baseline: Moderate tenderness Goal status: INITIAL         PLAN:   PT FREQUENCY: 1-2x/week   PT DURATION: 4 weeks   PLANNED INTERVENTIONS: Therapeutic exercises, Therapeutic activity, Neuromuscular re-education, Balance training, Gait training, Patient/Family education, Self Care, Joint mobilization, Stair training, Dry Needling, Spinal mobilization, Manual therapy, and Re-evaluation.   PLAN FOR NEXT SESSION: HEP review an update, ROM and stretching tasks, core and LE strengthening, aerobic tasks, review lifting mechanics especially deadlift technique   Margarette Canada, PTA 10/20/2022, 8:55 AM

## 2022-10-20 ENCOUNTER — Ambulatory Visit: Payer: Medicaid Other

## 2022-10-20 DIAGNOSIS — M5416 Radiculopathy, lumbar region: Secondary | ICD-10-CM | POA: Diagnosis not present

## 2022-10-20 DIAGNOSIS — M5459 Other low back pain: Secondary | ICD-10-CM

## 2022-10-20 DIAGNOSIS — M6281 Muscle weakness (generalized): Secondary | ICD-10-CM

## 2022-10-26 NOTE — Therapy (Incomplete)
OUTPATIENT PHYSICAL THERAPY TREATMENT NOTE   Patient Name: Glen Sullivan MRN: [id number] DOB:10/20/2000, 22 y.o., male Today's Date: 10/26/2022  PCP: Joycelyn Man, FNP   REFERRING PROVIDER: Lanae Crumbly, PA-C    END OF SESSION:       Past Medical History:  Diagnosis Date   H/O metal removed from eye    History of broken finger    4th finger left hand   Pinched nerve    Past Surgical History:  Procedure Laterality Date   LAPAROSCOPIC APPENDECTOMY N/A 06/25/2022   Procedure: APPENDECTOMY LAPAROSCOPIC;  Surgeon: Stark Klein, MD;  Location: [facility];  Service: General;  Laterality: N/A;   Patient Active Problem List   Diagnosis Date Noted   Low back pain 10/04/2022   Acute perforated appendicitis 06/25/2022   S/P appendectomy 06/25/2022    REFERRING DIAG: DIAGNOSIS: CHRONIC BACK PAIN AND INTERMITTENT RIGHT LE RADICULOPATHY SINCE AGE OF 14   THERAPY DIAG:  1. Pain in right lower leg   2. Radiculopathy, lumbar region     Rationale for Evaluation and Treatment Rehabilitation  PERTINENT HISTORY: HPI 22 year old Spanish male who is new patient to clinic was referred over for chronic low back pain and right lower extremity radiculopathy.  Patient states at the age of 80 he had the same issue and lumbar spine MRI was ordered.  Says that he was told that he had a "pinched nerve".  I do not have access to that MRI report.  He did go through formal PT which did give some improvement of his symptoms.  States that when he has been up all day at work he does get some pain into his right calf.  No left leg symptoms.  He is not currently taking any oral NSAIDs.  Patient recently had general surgery with Dr. Barry Dienes June 23, 2022 for perforated appendicitis with abscess.  Surgical pathology revealed neuroendocrine (carcinoid) tumor.  He was referred to gastroenterologist.  States that he was told by GI that he did not need to follow-up with them.  States that he has not been  told that he cannot use oral NSAIDs. Review of Systems No current complaints of cardiopulmonary GI/GU issues  PRECAUTIONS: None   SUBJECTIVE:                                                                                                                                                                                      SUBJECTIVE STATEMENT: *** Patient reports his R calf is not hurting today and that his back was hurting earlier this week when he had to do some bending.   PAIN:  Are you having pain? Yes: NPRS  scale: ***0/10 Pain location: R calf Pain description: ache Aggravating factors: stretching Relieving factors: rest and stretch    OBJECTIVE: (objective measures completed at initial evaluation unless otherwise dated)   DIAGNOSTIC FINDINGS:  None available   PATIENT SURVEYS:  FOTO 94(91 predicted)   SCREENING FOR RED FLAGS:            negative     COGNITION: Overall cognitive status: Within functional limits for tasks assessed                          SENSATION: WFL   MUSCLE LENGTH: Hamstrings: Right 80 deg; Left 80 deg Thomas test: negative PKB B   POSTURE: rounded shoulders   PALPATION: TTP lateral R gastroc, TP detected     LUMBAR ROM:    AROM eval  Flexion 100%  Extension 100%  Right lateral flexion 100%  Left lateral flexion 100%  Right rotation    Left rotation     (Blank rows = not tested)   LOWER EXTREMITY ROM:   WNL throughout   Active  Right eval Left eval  Hip flexion      Hip extension      Hip abduction      Hip adduction      Hip internal rotation      Hip external rotation      Knee flexion      Knee extension      Ankle dorsiflexion      Ankle plantarflexion      Ankle inversion      Ankle eversion       (Blank rows = not tested)   LOWER EXTREMITY MMT:     MMT Right eval Left eval Right 10/27/22 Left 10/27/22  Hip flexion        Hip extension        Hip abduction        Hip adduction        Hip internal  rotation        Hip external rotation        Knee flexion        Knee extension        Ankle dorsiflexion        Ankle plantarflexion 5- 5 ***   Ankle inversion        Ankle eversion         (Blank rows = not tested)   LUMBAR SPECIAL TESTS:  Straight leg raise test: Negative, Slump test: Negative, and Thomas test: Negative   FUNCTIONAL TESTS:  5 times sit to stand: 12s 30s SLS WNL B   GAIT: Distance walked: 63f x2 Assistive device utilized: None Level of assistance: Complete Independence Comments: unremarkable   TODAY'S TREATMENT:     [facility] Adult PT Treatment:                                                DATE: 10/27/2022 Therapeutic Exercise: Bike level 4 x 5 mins Palloff press 13# 2x10 BIL Chops 13# x10 BIL Reverse chops 7# x10 BIL hamstring stretch x1' BIL Single leg bridge 15/15 90/90 30s x2 Dead bugs 2x10 BIL Prone press ups x10 Slant board gastroc stretch x2' 4 in heel raises 1s conc/5s ecc 2x15 Seated soleus heel raises 2" step 25# KB 2x15 BIL Therapeutic Activity:  Update and review of HEP, goals, tests and measures  [facility] Adult PT Treatment:                                                DATE: 10/20/2022 Therapeutic Exercise: Bike level 4 x 5 mins Palloff press 13# 2x10 BIL Chops 13# x10 BIL Reverse chops 7# x10 BIL hamstring stretch x1' BIL Single leg bridge 15/15 90/90 30s x2 Dead bugs 2x10 BIL Prone press ups x10 Slant board gastroc stretch x2' 4 in heel raises 1s conc/5s ecc 2x15 Seated soleus heel raises 2" step 25# KB 2x15 BIL  [facility] Adult PT Treatment:                                                DATE: 10/13/2022 Therapeutic Exercise: Nustep level 5 x 5 mins while gathering subjective R hamstring stretch 30s BIL Single leg bridge 15/15 90/90 30s x2 90/90 with alternating heel taps 2x10 BIL Dead bugs 2x10 BIL Bird dogs x10 BIL Prone press ups x10 Slant board gastroc stretch x2' 4 in heel raises 1s conc/5s ecc 2x15 Deadlift from 8" box  15# KB 2x10 - cues for form                                                                                                                             PATIENT EDUCATION:  Education details: Discussed eval findings, rehab rationale and POC and patient is in agreement  Person educated: Patient Education method: Explanation Education comprehension: verbalized understanding and needs further education   HOME EXERCISE PROGRAM: Access Code: [email or web address] URL: [email or web address] Updated Date: 10/26/2022 Prepared by: Margarette Canada  Exercises - Gastroc Stretch on Wall  - 2 x daily - 5 x weekly - 1 sets - 3 reps - 30s hold - Soleus Stretch on Wall  - 2 x daily - 5 x weekly - 1 sets - 3 reps - 30s hold - Single Leg Heel Raise  - 2 x daily - 5 x weekly - 2 sets - 15 reps - Long Sitting Plantar Fascia Stretch with Towel  - 2 x daily - 5 x weekly - 1 sets - 3 reps - 30s hold - Standing Anti-Rotation Press with Anchored Resistance  - 1 x daily - 7 x weekly - 3 sets - 10 reps - Figure 4 Bridge  - 1 x daily - 7 x weekly - 3 sets - 10 reps - Supine Dead Bug with Leg Extension  - 1 x daily - 7 x weekly - 3 sets - 10 reps   ASSESSMENT:   CLINICAL IMPRESSION:  ***  Patient presents to PT reporting no current pain in his lower back  or calf and states his back was bothering him earlier this week when he was having to crouch down at work. Session today focused on core and calf strengthening as well as stretching for calf muscles. Increased difficulty of core exercises today to good affect, no increase in pain throughout session. Patient was able to tolerate all prescribed exercises with no adverse effects. Patient continues to benefit from skilled PT services and should be progressed as able to improve functional independence.     OBJECTIVE IMPAIRMENTS: decreased knowledge of condition, increased muscle spasms, and pain.    ACTIVITY LIMITATIONS: lifting and standing   PERSONAL FACTORS:  Age, Fitness, and Time since onset of injury/illness/exacerbation are also affecting patient's functional outcome.    REHAB POTENTIAL: Good   CLINICAL DECISION MAKING: Stable/uncomplicated   EVALUATION COMPLEXITY: Low     GOALS: Goals reviewed with patient? No   SHORT TERM GOALS: Target date: 10/15/2022     Patient to demonstrate independence in HEP  Baseline: [id number] Goal status: INITIAL   2.  5/5 R PF strength  Baseline: 5-/5 R PF strength Goal status: INITIAL   3.  2/10 worst pain Baseline: 4/10 worst pain Goal status: INITIAL   4.  Decrease TTP R calf TP from moderate to minimal Baseline: Moderate tenderness Goal status: INITIAL         PLAN:   PT FREQUENCY: 1-2x/week   PT DURATION: 4 weeks   PLANNED INTERVENTIONS: Therapeutic exercises, Therapeutic activity, Neuromuscular re-education, Balance training, Gait training, Patient/Family education, Self Care, Joint mobilization, Stair training, Dry Needling, Spinal mobilization, Manual therapy, and Re-evaluation.   PLAN FOR NEXT SESSION: DC   Margarette Canada, PTA 10/26/2022, 5:03 PM

## 2022-10-27 ENCOUNTER — Ambulatory Visit: Payer: Medicaid Other

## 2022-10-29 ENCOUNTER — Ambulatory Visit: Payer: Medicaid Other

## 2022-10-29 DIAGNOSIS — M5416 Radiculopathy, lumbar region: Secondary | ICD-10-CM

## 2022-10-29 DIAGNOSIS — M5459 Other low back pain: Secondary | ICD-10-CM

## 2022-10-29 DIAGNOSIS — M6281 Muscle weakness (generalized): Secondary | ICD-10-CM

## 2022-10-29 NOTE — Therapy (Signed)
OUTPATIENT PHYSICAL THERAPY TREATMENT NOTE/DC SUMMARY   Patient Name: Glen Sullivan MRN: [id number] DOB:03-Jan-2001, 22 y.o., male Today's Date: 10/29/2022  PCP: Joycelyn Man, FNP   REFERRING PROVIDER: Lanae Crumbly, PA-C   PHYSICAL THERAPY DISCHARGE SUMMARY  Visits from Start of Care: 5  Current functional level related to goals / functional outcomes: All goals met   Remaining deficits: None    Education / Equipment: HEP   Patient agrees to discharge. Patient goals were met. Patient is being discharged due to being pleased with the current functional level.  END OF SESSION:   PT End of Session - 10/29/22 0745     Visit Number 5    Number of Visits 4    Date for PT Re-Evaluation 11/12/22    Authorization Type [place] [facility]    PT Start Time 0745    PT Stop Time 0825    PT Time Calculation (min) 40 min    Activity Tolerance Patient tolerated treatment well    Behavior During Therapy WFL for tasks assessed/performed                Past Medical History:  Diagnosis Date   H/O metal removed from eye    History of broken finger    4th finger left hand   Pinched nerve    Past Surgical History:  Procedure Laterality Date   LAPAROSCOPIC APPENDECTOMY N/A 06/25/2022   Procedure: APPENDECTOMY LAPAROSCOPIC;  Surgeon: Stark Klein, MD;  Location: [facility];  Service: General;  Laterality: N/A;   Patient Active Problem List   Diagnosis Date Noted   Low back pain 10/04/2022   Acute perforated appendicitis 06/25/2022   S/P appendectomy 06/25/2022    REFERRING DIAG: DIAGNOSIS: CHRONIC BACK PAIN AND INTERMITTENT RIGHT LE RADICULOPATHY SINCE AGE OF 14   THERAPY DIAG:  1. Pain in right lower leg   2. Radiculopathy, lumbar region     Rationale for Evaluation and Treatment Rehabilitation  PERTINENT HISTORY: HPI 22 year old Spanish male who is new patient to clinic was referred over for chronic low back pain and right lower extremity radiculopathy.  Patient states at  the age of 18 he had the same issue and lumbar spine MRI was ordered.  Says that he was told that he had a "pinched nerve".  I do not have access to that MRI report.  He did go through formal PT which did give some improvement of his symptoms.  States that when he has been up all day at work he does get some pain into his right calf.  No left leg symptoms.  He is not currently taking any oral NSAIDs.  Patient recently had general surgery with Dr. Barry Dienes June 23, 2022 for perforated appendicitis with abscess.  Surgical pathology revealed neuroendocrine (carcinoid) tumor.  He was referred to gastroenterologist.  States that he was told by GI that he did not need to follow-up with them.  States that he has not been told that he cannot use oral NSAIDs. Review of Systems No current complaints of cardiopulmonary GI/GU issues  PRECAUTIONS: None   SUBJECTIVE:  SUBJECTIVE STATEMENT: No pain today, feels he can self manage symptoms at this point.   PAIN:  Are you having pain? Yes: NPRS scale: 0/10 Pain location: R calf Pain description: ache Aggravating factors: stretching Relieving factors: rest and stretch    OBJECTIVE: (objective measures completed at initial evaluation unless otherwise dated)   DIAGNOSTIC FINDINGS:  None available   PATIENT SURVEYS:  FOTO 94(91 predicted)   SCREENING FOR RED FLAGS:            negative     COGNITION: Overall cognitive status: Within functional limits for tasks assessed                          SENSATION: WFL   MUSCLE LENGTH: Hamstrings: Right 80 deg; Left 80 deg Thomas test: negative PKB B   POSTURE: rounded shoulders   PALPATION: TTP lateral R gastroc, TP detected     LUMBAR ROM:    AROM eval  Flexion 100%  Extension 100%  Right lateral flexion 100%  Left  lateral flexion 100%  Right rotation    Left rotation     (Blank rows = not tested)   LOWER EXTREMITY ROM:   WNL throughout   Active  Right eval Left eval  Hip flexion      Hip extension      Hip abduction      Hip adduction      Hip internal rotation      Hip external rotation      Knee flexion      Knee extension      Ankle dorsiflexion      Ankle plantarflexion      Ankle inversion      Ankle eversion       (Blank rows = not tested)   LOWER EXTREMITY MMT:     MMT Right eval Left eval R 10/29/22  Hip flexion       Hip extension       Hip abduction       Hip adduction       Hip internal rotation       Hip external rotation       Knee flexion       Knee extension       Ankle dorsiflexion       Ankle plantarflexion 5- 5 5  Ankle inversion       Ankle eversion        (Blank rows = not tested)   LUMBAR SPECIAL TESTS:  Straight leg raise test: Negative, Slump test: Negative, and Thomas test: Negative   FUNCTIONAL TESTS:  5 times sit to stand: 12s 30s SLS WNL B   GAIT: Distance walked: 50f x2 Assistive device utilized: None Level of assistance: Complete Independence Comments: unremarkable   TODAY'S TREATMENT:     [facility] Adult PT Treatment:                                                DATE: 10/29/22 Therapeutic Exercise: Nustep level 6 x 8 mins R hamstring stretch 30s BIL Single leg bridge 15/15 90/90 30s x2 90/90 heel taps 30s x2 Bird dogs 20x Bil Prone press ups x10 f/b 10 with PT OP Slant board gastroc/soleus stretch 30s x2 ea. 4 in heel raises R only 10x  [facility] Adult PT  Treatment:                                                DATE: 10/20/2022 Therapeutic Exercise: Bike level 4 x 5 mins Palloff press 13# 2x10 BIL Chops 13# x10 BIL Reverse chops 7# x10 BIL hamstring stretch x1' BIL Single leg bridge 15/15 90/90 30s x2 Dead bugs 2x10 BIL Prone press ups x10 Slant board gastroc stretch x2' 4 in heel raises 1s conc/5s ecc 2x15 Seated soleus  heel raises 2" step 25# KB 2x15 BIL  [facility] Adult PT Treatment:                                                DATE: 10/13/2022 Therapeutic Exercise: Nustep level 5 x 5 mins while gathering subjective R hamstring stretch 30s BIL Single leg bridge 15/15 90/90 30s x2 90/90 with alternating heel taps 2x10 BIL Dead bugs 2x10 BIL Bird dogs x10 BIL Prone press ups x10 Slant board gastroc stretch x2' 4 in heel raises 1s conc/5s ecc 2x15 Deadlift from 8" box 15# KB 2x10 - cues for form  [facility] Adult PT Treatment:                                                DATE: 10/05/21 Therapeutic Exercise: Nustep L4 8 min R hamstring stretch seated 30s x2 R PF stretch 30s x2 R piriformis stretch fig 4 w/towel Single leg bridge 15/15 90/90 30s x2 Slant board gastroc stretch 30s x2 Slant board soleus stretch 30s x2 Prone press 10x, then 10x with PT OP 4 in heel raises 1s conc/5s ecc 15x                                                                                                                            PATIENT EDUCATION:  Education details: Discussed eval findings, rehab rationale and POC and patient is in agreement  Person educated: Patient Education method: Explanation Education comprehension: verbalized understanding and needs further education   HOME EXERCISE PROGRAM: Access Code: [email or web address] URL: [email or web address] Date: 09/17/2022 Prepared by: Sharlynn Oliphant   Exercises - Gastroc Stretch on Wall  - 2 x daily - 5 x weekly - 1 sets - 3 reps - 30s hold - Soleus Stretch on Wall  - 2 x daily - 5 x weekly - 1 sets - 3 reps - 30s hold - Single Leg Heel Raise  - 2 x daily - 5 x weekly - 2 sets - 15 reps - Long Sitting Plantar Fascia Stretch with Towel  - 2 x daily -  5 x weekly - 1 sets - 3 reps - 30s hold   ASSESSMENT:   CLINICAL IMPRESSION: All goals met, patient reports no pan or restriction and feels ready for independent management    OBJECTIVE IMPAIRMENTS: decreased  knowledge of condition, increased muscle spasms, and pain.    ACTIVITY LIMITATIONS: lifting and standing   PERSONAL FACTORS: Age, Fitness, and Time since onset of injury/illness/exacerbation are also affecting patient's functional outcome.    REHAB POTENTIAL: Good   CLINICAL DECISION MAKING: Stable/uncomplicated   EVALUATION COMPLEXITY: Low     GOALS: Goals reviewed with patient? No   SHORT TERM GOALS: Target date: 10/15/2022     Patient to demonstrate independence in HEP  Baseline: [id number] Goal status: Met   2.  5/5 R PF strength  Baseline: 5-/5 R PF strength; 10/29/22 5/5 Goal status: Met   3.  2/10 worst pain Baseline: 4/10 worst pain; 10/29/22 0/10 Goal status: Met   4.  Decrease TTP R calf TP from moderate to minimal Baseline: Moderate tenderness; 10/29/22 No tenderness Goal status: Met        PLAN:   PT FREQUENCY: 1-2x/week   PT DURATION: 4 weeks   PLANNED INTERVENTIONS: Therapeutic exercises, Therapeutic activity, Neuromuscular re-education, Balance training, Gait training, Patient/Family education, Self Care, Joint mobilization, Stair training, Dry Needling, Spinal mobilization, Manual therapy, and Re-evaluation.   PLAN FOR NEXT SESSION: DC to HEP   Lanice Shirts, PT 10/29/2022, 8:25 AM

## 2023-07-24 ENCOUNTER — Other Ambulatory Visit: Payer: Self-pay | Admitting: General Surgery

## 2023-07-24 DIAGNOSIS — D3A8 Other benign neuroendocrine tumors: Secondary | ICD-10-CM

## 2023-07-29 ENCOUNTER — Other Ambulatory Visit: Payer: Medicaid Other

## 2023-07-31 ENCOUNTER — Ambulatory Visit
Admission: RE | Admit: 2023-07-31 | Discharge: 2023-07-31 | Disposition: A | Discharge status: Home / Self Care | Payer: Medicaid Other | Source: Ambulatory Visit | Attending: General Surgery | Admitting: General Surgery

## 2023-07-31 DIAGNOSIS — D3A8 Other benign neuroendocrine tumors: Secondary | ICD-10-CM

## 2023-07-31 MED ORDER — IOPAMIDOL (ISOVUE-300) INJECTION 61%
100.0000 mL | Freq: Once | INTRAVENOUS | Status: AC | PRN
  Administered 2023-07-31: 100 mL via INTRAVENOUS

## 2023-10-07 ENCOUNTER — Encounter: Payer: Medicaid Other | Admitting: Nurse Practitioner

## 2023-11-26 ENCOUNTER — Ambulatory Visit (INDEPENDENT_AMBULATORY_CARE_PROVIDER_SITE_OTHER): Payer: Medicaid Other | Admitting: Physician Assistant

## 2023-11-26 ENCOUNTER — Encounter (INDEPENDENT_AMBULATORY_CARE_PROVIDER_SITE_OTHER): Payer: Self-pay | Admitting: Physician Assistant

## 2023-11-26 VITALS — BP 143/90 | HR 80 | Temp 98.0°F | Ht 70.0 in | Wt 217.0 lb

## 2023-11-26 DIAGNOSIS — M545 Low back pain, unspecified: Secondary | ICD-10-CM | POA: Diagnosis not present

## 2023-11-26 DIAGNOSIS — Z6831 Body mass index (BMI) 31.0-31.9, adult: Secondary | ICD-10-CM

## 2023-11-26 DIAGNOSIS — E6609 Other obesity due to excess calories: Secondary | ICD-10-CM

## 2023-11-26 DIAGNOSIS — I10 Essential (primary) hypertension: Secondary | ICD-10-CM

## 2023-11-26 DIAGNOSIS — C7A020 Malignant carcinoid tumor of the appendix: Secondary | ICD-10-CM

## 2023-11-26 DIAGNOSIS — E66811 Obesity, class 1: Secondary | ICD-10-CM

## 2023-11-26 DIAGNOSIS — Z0289 Encounter for other administrative examinations: Secondary | ICD-10-CM

## 2023-11-26 NOTE — Progress Notes (Signed)
 Office: [phone]  /  Fax: [phone]   Initial Visit  Glen Sullivan was seen in clinic today to evaluate for obesity. He is interested in losing weight to improve overall health and reduce the risk of weight related complications. He presents today to review program treatment options, initial physical assessment, and evaluation.     He was referred by: PCP  When asked what else they would like to accomplish? He states: Adopt healthier eating patterns, Improve appearance, Improve self-confidence, and Lose a target amount of weight : 200  lbs in 6-9 months.  Weight history: Heavier as a child and noted more weight gain around [phone].  Had surgery for neuroendocrine appendiceal tumor and has lost some weight following this surgery.   When asked how has your weight affected you? He states: Has affected self-esteem, Contributed to orthopedic problems or mobility issues, and Problems with eating patterns  Some associated conditions: Hypertension  Contributing factors: Family history of obesity, Consumption of processed foods, and Eating patterns  Weight promoting medications identified: None  Current nutrition plan: None  Current level of physical activity: Strength training 30-60 minutes, NEAT, and Other: Dancing 3 times weekly  Current or previous pharmacotherapy: None  Response to medication: Never tried medications   Past medical history includes:   Past Medical History:  Diagnosis Date   H/O metal removed from eye    History of broken finger    4th finger left hand   Pinched nerve      Objective:   BP (!) 143/90   Pulse 80   Temp 98 F (36.7 C)   Ht 5\' 10"  (1.778 m)   Wt 217 lb (98.4 kg)   SpO2 94%   BMI 31.14 kg/m  He was weighed on the bioimpedance scale: Body mass index is 31.14 kg/m.  Peak Weight:255 lbs , Body Fat%:27.1%, Visceral Fat Rating:9, Weight trend over the last 12 months: Unchanged  General:  Alert, oriented and cooperative.  Patient is in no acute distress.  Respiratory: Normal respiratory effort, no problems with respiration noted   Gait: able to ambulate independently  Mental Status: Normal mood and affect. Normal behavior. Normal judgment and thought content.   DIAGNOSTIC DATA REVIEWED:  BMET    Component Value Date/Time   NA 137 06/26/2022 0453   K 4.3 06/26/2022 0453   CL 103 06/26/2022 0453   CO2 25 06/26/2022 0453   GLUCOSE 162 (H) 06/26/2022 0453   BUN 9 06/26/2022 0453   CREATININE 0.77 06/26/2022 0453   CALCIUM 9.4 06/26/2022 0453   GFRNONAA >60 06/26/2022 0453   No results found for: "HGBA1C" No results found for: "INSULIN" CBC    Component Value Date/Time   WBC 12.7 (H) 06/26/2022 0453   RBC 4.57 06/26/2022 0453   HGB 13.9 06/26/2022 0453   HCT 40.0 06/26/2022 0453   PLT 168 06/26/2022 0453   MCV 87.5 06/26/2022 0453   MCH 30.4 06/26/2022 0453   MCHC 34.8 06/26/2022 0453   RDW 12.2 06/26/2022 0453   Iron/TIBC/Ferritin/ %Sat No results found for: "IRON", "TIBC", "FERRITIN", "IRONPCTSAT" Lipid Panel  No results found for: "CHOL", "TRIG", "HDL", "CHOLHDL", "VLDL", "LDLCALC", "LDLDIRECT" Hepatic Function Panel     Component Value Date/Time   PROT 7.9 06/25/2022 1504   ALBUMIN 4.8 06/25/2022 1504   AST 15 06/25/2022 1504   ALT 14 06/25/2022 1504   ALKPHOS 61 06/25/2022 1504   BILITOT 1.0 06/25/2022 1504   No results found for: "TSH"   Assessment and Plan:  Primary hypertension  Low back pain, unspecified back pain laterality, unspecified chronicity, unspecified whether sciatica present  Malignant carcinoid tumor of appendix (HCC)  Class 1 obesity due to excess calories with serious comorbidity and body mass index (BMI) of 31.0 to 31.9 in adult        Obesity Treatment / Action Plan:  Patient will work on garnering support from family and friends to begin weight loss journey. Will work on eliminating or reducing the presence of highly palatable, calorie dense  foods in the home. Will complete provided nutritional and psychosocial assessment questionnaire before the next appointment. Will be scheduled for indirect calorimetry to determine resting energy expenditure in a fasting state.  This will allow [place] to create a reduced calorie, high-protein meal plan to promote loss of fat mass while preserving muscle mass. Will avoid skipping meals which may result in increased hunger signals and overeating at certain times. Will reduce the frequency of eating out and making healthier choices by advanced menu planning. Counseled on the health benefits of losing 5%-15% of total body weight. Was counseled on nutritional approaches to weight loss and benefits of reducing processed foods and consuming plant-based foods and high quality protein as part of nutritional weight management. Was counseled on pharmacotherapy and role as an adjunct in weight management.  Will work on increasing water intake with a goal of 125 ounces for men and 91 ounces for women.   Obesity Education Performed Today:  He was weighed on the bioimpedance scale and results were discussed and documented in the synopsis.  We discussed obesity as a disease and the importance of a more detailed evaluation of all the factors contributing to the disease.  We discussed the importance of long term lifestyle changes which include nutrition, exercise and behavioral modifications as well as the importance of customizing this to his specific health and social needs.  We discussed the benefits of reaching a healthier weight to alleviate the symptoms of existing conditions and reduce the risks of the biomechanical, metabolic and psychological effects of obesity.  Glen Sullivan appears to be in the action stage of change and states they are ready to start intensive lifestyle modifications and behavioral modifications.  30 minutes was spent today on this visit including the above counseling,  pre-visit chart review, and post-visit documentation.  Reviewed by clinician on day of visit: allergies, medications, problem list, medical history, surgical history, family history, social history, and previous encounter notes pertinent to obesity diagnosis.   Serrita Lueth,PA-C

## 2023-12-31 ENCOUNTER — Encounter (INDEPENDENT_AMBULATORY_CARE_PROVIDER_SITE_OTHER): Payer: Self-pay | Admitting: Family Medicine

## 2023-12-31 ENCOUNTER — Ambulatory Visit (INDEPENDENT_AMBULATORY_CARE_PROVIDER_SITE_OTHER): Payer: Medicaid Other | Admitting: Family Medicine

## 2023-12-31 VITALS — BP 133/78 | HR 76 | Temp 97.3°F | Ht 70.0 in | Wt 213.0 lb

## 2023-12-31 DIAGNOSIS — R5383 Other fatigue: Secondary | ICD-10-CM | POA: Diagnosis not present

## 2023-12-31 DIAGNOSIS — E6609 Other obesity due to excess calories: Secondary | ICD-10-CM | POA: Insufficient documentation

## 2023-12-31 DIAGNOSIS — R739 Hyperglycemia, unspecified: Secondary | ICD-10-CM | POA: Insufficient documentation

## 2023-12-31 DIAGNOSIS — I10 Essential (primary) hypertension: Secondary | ICD-10-CM

## 2023-12-31 DIAGNOSIS — Z6830 Body mass index (BMI) 30.0-30.9, adult: Secondary | ICD-10-CM | POA: Diagnosis not present

## 2023-12-31 DIAGNOSIS — R0609 Other forms of dyspnea: Secondary | ICD-10-CM | POA: Diagnosis not present

## 2023-12-31 DIAGNOSIS — E669 Obesity, unspecified: Secondary | ICD-10-CM | POA: Diagnosis not present

## 2023-12-31 DIAGNOSIS — Z1331 Encounter for screening for depression: Secondary | ICD-10-CM | POA: Diagnosis not present

## 2023-12-31 DIAGNOSIS — E162 Hypoglycemia, unspecified: Secondary | ICD-10-CM

## 2024-01-01 LAB — CMP14+EGFR
ALT: 14 IU/L (ref 0–44)
AST: 19 IU/L (ref 0–40)
Albumin: 4.7 g/dL (ref 4.3–5.2)
Alkaline Phosphatase: 58 IU/L (ref 44–121)
BUN/Creatinine Ratio: 11 (ref 9–20)
BUN: 10 mg/dL (ref 6–20)
Bilirubin Total: 0.8 mg/dL (ref 0.0–1.2)
CO2: 23 mmol/L (ref 20–29)
Calcium: 9.4 mg/dL (ref 8.7–10.2)
Chloride: 100 mmol/L (ref 96–106)
Creatinine, Ser: 0.89 mg/dL (ref 0.76–1.27)
Globulin, Total: 2 g/dL (ref 1.5–4.5)
Glucose: 86 mg/dL (ref 70–99)
Potassium: 4.2 mmol/L (ref 3.5–5.2)
Sodium: 140 mmol/L (ref 134–144)
Total Protein: 6.7 g/dL (ref 6.0–8.5)
eGFR: 123 mL/min/{1.73_m2} (ref >59)

## 2024-01-01 LAB — CBC WITH DIFFERENTIAL/PLATELET
Basophils Absolute: 0 10*3/uL (ref 0.0–0.2)
Basos: 1 %
EOS (ABSOLUTE): 0.1 10*3/uL (ref 0.0–0.4)
Eos: 2 %
Hematocrit: 43 % (ref 37.5–51.0)
Hemoglobin: 14.7 g/dL (ref 13.0–17.7)
Immature Grans (Abs): 0 10*3/uL (ref 0.0–0.1)
Immature Granulocytes: 0 %
Lymphocytes Absolute: 1.4 10*3/uL (ref 0.7–3.1)
Lymphs: 35 %
MCH: 30.8 pg (ref 26.6–33.0)
MCHC: 34.2 g/dL (ref 31.5–35.7)
MCV: 90 fL (ref 79–97)
Monocytes Absolute: 0.4 10*3/uL (ref 0.1–0.9)
Monocytes: 10 %
Neutrophils Absolute: 2.1 10*3/uL (ref 1.4–7.0)
Neutrophils: 52 %
Platelets: 151 10*3/uL (ref 150–450)
RBC: 4.78 x10E6/uL (ref 4.14–5.80)
RDW: 12.5 % (ref 11.6–15.4)
WBC: 4.1 10*3/uL (ref 3.4–10.8)

## 2024-01-01 LAB — FOLATE: Folate: >20 ng/mL (ref >3.0)

## 2024-01-01 LAB — T4, FREE: Free T4: 1.63 ng/dL (ref 0.82–1.77)

## 2024-01-01 LAB — VITAMIN D 25 HYDROXY (VIT D DEFICIENCY, FRACTURES): Vit D, 25-Hydroxy: 31.9 ng/mL (ref 30.0–100.0)

## 2024-01-01 LAB — LIPID PANEL WITH LDL/HDL RATIO
Cholesterol, Total: 146 mg/dL (ref 100–199)
HDL: 47 mg/dL (ref >39)
LDL Chol Calc (NIH): 84 mg/dL (ref 0–99)
LDL/HDL Ratio: 1.8 ratio (ref 0.0–3.6)
Triglycerides: 75 mg/dL (ref 0–149)
VLDL Cholesterol Cal: 15 mg/dL (ref 5–40)

## 2024-01-01 LAB — T3: T3, Total: 129 ng/dL (ref 71–180)

## 2024-01-01 LAB — HEMOGLOBIN A1C
Est. average glucose Bld gHb Est-mCnc: 97 mg/dL
Hgb A1c MFr Bld: 5 % (ref 4.8–5.6)

## 2024-01-01 LAB — VITAMIN B12: Vitamin B-12: 424 pg/mL (ref 232–1245)

## 2024-01-01 LAB — INSULIN, RANDOM: INSULIN: 6.5 u[IU]/mL (ref 2.6–24.9)

## 2024-01-01 LAB — TSH: TSH: 1.6 u[IU]/mL (ref 0.450–4.500)

## 2024-01-02 NOTE — Progress Notes (Signed)
 Office: [phone]  /  Fax: [phone]  WEIGHT SUMMARY AND BIOMETRICS   Chief Complaint: OBESITY   History of Present Illness Glen Sullivan is a 23 year old male who presents for weight management consultation. He was referred by his primary care doctor for weight management.  He is seeking assistance with weight management, having already achieved some weight loss but requiring further support. He identifies a need for better nutrition as a primary area of focus. He has not been weighing himself frequently at home, with the last recorded weight being from a previous visit. He has attempted meal prepping in the past but finds it challenging to maintain consistently, especially due to time constraints on weekends and the need to prepare meals for his mother as well.  He is active, walking approximately 9,000 steps a day due to work and after-work activities, and engaging in strength training once a week. He experiences mild fatigue and mild shortness of breath on exertion. There is no mention of current medication use for these symptoms.  In terms of social history, he routinely eats out, which he acknowledges is not conducive to weight loss. He has not used a food scale before but is open to learning how to measure food portions accurately. He currently walks an average of 9,000 steps per day and is doing strength training exercises for 30 minutes once per week.  His highest weight was 255 pounds, and he has lost 30 pounds on his own but is struggling to reach his goal of 200 pounds.  He would like to achieve that goal in about 6 months.  He started gaining excess weight in high school and identified that the weight gain was due to poor eating habits.  He eats outside the home 7 times per week, consuming fast food or takeout during those meals.  His mother does most of the shopping and cooking.  He enjoys sweets and chicken but does not like spinach or mushrooms.  He does not  consider himself a picky eater.  He snacks frequently in the evenings and skips breakfast half the week.  His family frequently offers him food, which makes healthy eating difficult.  He struggles with making poor food choices but does not feel that he experiences excessive hunger. He does feel he has a problem with portion control and tends to eat rapidly.  He notes emotional eating behaviors occurring when he is stressed, sad, or bored.  He describes emotional eating as a reward and a means of self-comfort.  He feels guilt about making poor food choices, recognizing that he was not really hungry.  He perceives himself as having an all-or-nothing personality.  He has never been diagnosed with an eating disorder.  He eats larger portions than the average person 2 to 3 times per week.  He has a modified PHQ-9 mood and food score of 6 and an Epworth sleepiness score of 2.   DIAGNOSTIC EKG: Normal Resting Metabolic Rate calculated to be 2167 REE measured by Indirect calorimeter: 2002, which is lower than expected.   PHYSICAL EXAM:  Blood pressure 133/78, pulse 76, temperature (!) 97.3 F (36.3 C), height 5\' 10"  (1.778 m), weight 213 lb (96.6 kg), SpO2 99%. Body mass index is 30.56 kg/m.  DIAGNOSTIC DATA REVIEWED:  BMET    Component Value Date/Time   NA 140 12/31/2023 0833   K 4.2 12/31/2023 0833   CL 100 12/31/2023 0833   CO2 23 12/31/2023 0833   GLUCOSE 86 12/31/2023 0833  GLUCOSE 162 (H) 06/26/2022 0453   BUN 10 12/31/2023 0833   CREATININE 0.89 12/31/2023 0833   CALCIUM 9.4 12/31/2023 0833   GFRNONAA >60 06/26/2022 0453   Lab Results  Component Value Date   HGBA1C 5.0 12/31/2023   Lab Results  Component Value Date   INSULIN 6.5 12/31/2023   Lab Results  Component Value Date   TSH 1.600 12/31/2023   CBC    Component Value Date/Time   WBC 4.1 12/31/2023 0833   WBC 12.7 (H) 06/26/2022 0453   RBC 4.78 12/31/2023 0833   RBC 4.57 06/26/2022 0453   HGB 14.7  12/31/2023 0833   HCT 43.0 12/31/2023 0833   PLT 151 12/31/2023 0833   MCV 90 12/31/2023 0833   MCH 30.8 12/31/2023 0833   MCH 30.4 06/26/2022 0453   MCHC 34.2 12/31/2023 0833   MCHC 34.8 06/26/2022 0453   RDW 12.5 12/31/2023 0833   Iron Studies No results found for: "IRON", "TIBC", "FERRITIN", "IRONPCTSAT" Lipid Panel     Component Value Date/Time   CHOL 146 12/31/2023 0833   TRIG 75 12/31/2023 0833   HDL 47 12/31/2023 0833   LDLCALC 84 12/31/2023 0833   Hepatic Function Panel     Component Value Date/Time   PROT 6.7 12/31/2023 0833   ALBUMIN 4.7 12/31/2023 0833   AST 19 12/31/2023 0833   ALT 14 12/31/2023 0833   ALKPHOS 58 12/31/2023 0833   BILITOT 0.8 12/31/2023 0833      Component Value Date/Time   TSH 1.600 12/31/2023 0833   Nutritional Lab Results  Component Value Date   VD25OH 31.9 12/31/2023     Assessment and Plan Assessment & Plan  Obesity He has achieved some weight loss but requires further reduction. Nutritional deficiencies may be contributing to metabolic slowdown, hindering weight loss. He is active, walking 9,000 steps daily and engaging in weekly strength training. Proper nutrition is crucial to prevent metabolic slowdown and support weight loss. Regular dining out is discouraged as it conflicts with weight loss objectives. - Order blood work to evaluate factors related to weight loss resistance - Implement a two-week Category 4 structured eating plan with regular food, avoiding elaborate cooking - Ensure consumption of all prescribed portions to maintain adequate nutrition - Advise against home weighing to prioritize nutritional goals over weight - Schedule follow-up in two weeks to review test results and discuss the eating plan  Fatigue and dyspnea on exertion-mild -Will check labs to see if anything unrelated to weight may be contributing  HTN - -Check labs - Start category 4 eating plan - Follow up in 2 weeks  Hypoglycemia He may  experience excessive hunger signals potentially linked to hypoglycemia. Hunger patterns will be evaluated to determine if excessive hunger signals are present. Appropriate hunger should occur three times daily before meals. - Monitor hunger levels and report excessive hunger post-meals      I have personally spent 42 minutes total time today in preparation, patient care, and documentation for this visit, including the following: review of clinical lab tests; review of medical tests/procedures/services.    He was informed of the importance of frequent follow up visits to maximize his success with intensive lifestyle modifications for his multiple health conditions.    Quillian Quince, MD

## 2024-01-14 ENCOUNTER — Encounter (INDEPENDENT_AMBULATORY_CARE_PROVIDER_SITE_OTHER): Payer: Self-pay | Admitting: Family Medicine

## 2024-01-14 ENCOUNTER — Ambulatory Visit (INDEPENDENT_AMBULATORY_CARE_PROVIDER_SITE_OTHER): Payer: Medicaid Other | Admitting: Family Medicine

## 2024-01-14 VITALS — BP 125/73 | HR 69 | Temp 98.7°F | Ht 70.0 in | Wt 213.0 lb

## 2024-01-14 DIAGNOSIS — E88819 Insulin resistance, unspecified: Secondary | ICD-10-CM | POA: Diagnosis not present

## 2024-01-14 DIAGNOSIS — E669 Obesity, unspecified: Secondary | ICD-10-CM | POA: Insufficient documentation

## 2024-01-14 DIAGNOSIS — E559 Vitamin D deficiency, unspecified: Secondary | ICD-10-CM | POA: Insufficient documentation

## 2024-01-14 DIAGNOSIS — Z6830 Body mass index (BMI) 30.0-30.9, adult: Secondary | ICD-10-CM

## 2024-01-14 DIAGNOSIS — E538 Deficiency of other specified B group vitamins: Secondary | ICD-10-CM | POA: Insufficient documentation

## 2024-01-14 MED ORDER — VITAMIN D (ERGOCALCIFEROL) 1.25 MG (50000 UNIT) PO CAPS: 50000.0000 [IU] | ORAL_CAPSULE | ORAL | 0 refills | Status: DC

## 2024-01-14 NOTE — Progress Notes (Signed)
 Office: [phone]  /  Fax: [phone]  WEIGHT SUMMARY AND BIOMETRICS  Anthropometric Measurements Height: 5\' 10"  (1.778 m) Weight: 213 lb (96.6 kg) BMI (Calculated): 30.56 Weight at Last Visit: 213lb Weight Lost Since Last Visit: 0lb Weight Gained Since Last Visit: 0lb Starting Weight: 213lb Total Weight Loss (lbs): 0 lb (0 kg) Peak Weight: 255lb Waist Measurement : 38 inches   Body Composition  Body Fat %: 25.8 % Fat Mass (lbs): 55 lbs Muscle Mass (lbs): 150.2 lbs Total Body Water (lbs): 113.4 lbs Visceral Fat Rating : 8   Other Clinical Data Fasting: No Labs: no Today's Visit #: 2 Starting Date: 12/31/23    Chief Complaint: OBESITY    History of Present Illness He is a 23 year old male who presents for obesity treatment plan follow-up.  He follows the prescribed category four eating plan about fifty percent of the time, finding meal planning challenging due to hunger and difficulty with consistency. He has attempted to adhere to the plan for several days but often feels hungry a few hours after meals. Switching lunch and dinner portions has provided some relief.  He maintains an active lifestyle, attending the gym, dance classes, and playing soccer for about an hour, five times a week. Despite this, he has only maintained his weight over the last two weeks.  He experiences consistent fatigue, struggling to stay awake during social events and not waking up refreshed. He attributes some of this fatigue to his vitamin D level, which is 32, slightly above the deficiency threshold. His B12 level is 424, slightly below the desired level, but he is not supplementing it as his eating plan includes B12-rich foods.  Lunch preparation is challenging due to a lack of motivation. He uses a scale to measure sandwich meat, finding it satisfactory, but still feels the portion is insufficient. He prefers tortillas, particularly corn tortillas, but struggles to find low-calorie  options that meet his dietary needs.  He incorporates fruits like apples, peaches, and blueberries into his diet but has issues with berries spoiling quickly. He has tried [place] yogurt and cottage cheese but dislikes the texture of [place] yogurt. He uses yogurt as a protein source but does not consistently include it in his meals.      PHYSICAL EXAM:  Blood pressure 125/73, pulse 69, temperature 98.7 F (37.1 C), height 5\' 10"  (1.778 m), weight 213 lb (96.6 kg), SpO2 98%. Body mass index is 30.56 kg/m.  DIAGNOSTIC DATA REVIEWED:  BMET    Component Value Date/Time   NA 140 12/31/2023 0833   K 4.2 12/31/2023 0833   CL 100 12/31/2023 0833   CO2 23 12/31/2023 0833   GLUCOSE 86 12/31/2023 0833   GLUCOSE 162 (H) 06/26/2022 0453   BUN 10 12/31/2023 0833   CREATININE 0.89 12/31/2023 0833   CALCIUM 9.4 12/31/2023 0833   GFRNONAA >60 06/26/2022 0453   Lab Results  Component Value Date   HGBA1C 5.0 12/31/2023   Lab Results  Component Value Date   INSULIN 6.5 12/31/2023   Lab Results  Component Value Date   TSH 1.600 12/31/2023   CBC    Component Value Date/Time   WBC 4.1 12/31/2023 0833   WBC 12.7 (H) 06/26/2022 0453   RBC 4.78 12/31/2023 0833   RBC 4.57 06/26/2022 0453   HGB 14.7 12/31/2023 0833   HCT 43.0 12/31/2023 0833   PLT 151 12/31/2023 0833   MCV 90 12/31/2023 0833   MCH 30.8 12/31/2023 0833   MCH 30.4 06/26/2022  0453   MCHC 34.2 12/31/2023 0833   MCHC 34.8 06/26/2022 0453   RDW 12.5 12/31/2023 0833   Iron Studies No results found for: "IRON", "TIBC", "FERRITIN", "IRONPCTSAT" Lipid Panel     Component Value Date/Time   CHOL 146 12/31/2023 0833   TRIG 75 12/31/2023 0833   HDL 47 12/31/2023 0833   LDLCALC 84 12/31/2023 0833   Hepatic Function Panel     Component Value Date/Time   PROT 6.7 12/31/2023 0833   ALBUMIN 4.7 12/31/2023 0833   AST 19 12/31/2023 0833   ALT 14 12/31/2023 0833   ALKPHOS 58 12/31/2023 0833   BILITOT 0.8 12/31/2023 0833       Component Value Date/Time   TSH 1.600 12/31/2023 0623   Nutritional Lab Results  Component Value Date   VD25OH 31.9 12/31/2023     Assessment and Plan Assessment & Plan Obesity He adheres to the category four eating plan about 50% of the time and has maintained his weight over the last two weeks. He is physically active, engaging in gym activities, dancing, and soccer for about an hour five times a week. He finds meal planning challenging, particularly with lunch, and experiences hunger after meals. The importance of protein intake to support metabolism and muscle building was discussed, as well as the need to avoid carbohydrates that cause rapid blood sugar fluctuations. Alternatives such as using tortillas instead of bread and adjusting meal sizes were discussed to better suit his preferences and improve adherence. - Continue category four eating plan with adjustments to meal timing and composition - Increase protein intake to support metabolism and muscle building - Avoid high-carbohydrate foods that cause rapid blood sugar fluctuations - Consider using My Fitness app or similar for tracking nutrition - Explore alternative meal options such as tortillas instead of bread  Insulin Resistance His fasting insulin level is 6.5, slightly above the normal range of below 5, indicating mild insulin resistance with normal glucose levels. Emphasized the importance of protein intake to maintain metabolism and energy levels. Provided guidance on dietary adjustments to manage insulin resistance, including increasing protein intake and reducing high-carbohydrate foods. - Provide handout on insulin resistance - Encourage dietary adjustments to manage insulin resistance  Vitamin D Deficiency His vitamin D level is 32, below the optimal range of 50. This deficiency may contribute to fatigue. Prescription vitamin D is more effective than over-the-counter options and has no side effects. The plan is to  supplement with prescription vitamin D once a week for three months and recheck levels to assess improvement. - Prescribe vitamin D once a week for three months - Recheck vitamin D levels in three months  B12 Insufficiency His B12 level is 424, slightly below the desired level of 500, which may contribute to fatigue. No supplementation is needed at this time because his eating plan includes sufficient B12. Levels will be re-evaluated in three months to determine if supplementation is necessary. - Recheck B12 levels in three months  General Health Maintenance Recent lab results, including cholesterol, kidney, and liver function, are within normal limits. Guidance was provided on dietary adjustments and the importance of maintaining a balanced diet to support overall health. Encouraged to continue regular physical activity and maintain a balanced diet with adequate protein and low carbohydrates. - Continue regular physical activity - Maintain balanced diet with adequate protein and low carbohydrates  Follow-up He has a follow-up appointment scheduled for May 6. Recommended scheduling another appointment a couple of weeks after that to monitor progress  and adjust the treatment plan as needed. - Schedule follow-up appointment a couple of weeks after May 6     I have personally spent 42 minutes total time today reviewing labs with [facility],, discussing new diagnoses and modifying his eating plan to meet his nutritional goal as well as his personal preferences and time spent documenting this note.   He was informed of the importance of frequent follow up visits to maximize his success with intensive lifestyle modifications for his multiple health conditions.    Jasmine Mesi, MD

## 2024-02-04 ENCOUNTER — Encounter (INDEPENDENT_AMBULATORY_CARE_PROVIDER_SITE_OTHER): Payer: Self-pay | Admitting: Family Medicine

## 2024-02-04 ENCOUNTER — Ambulatory Visit (INDEPENDENT_AMBULATORY_CARE_PROVIDER_SITE_OTHER): Admitting: Family Medicine

## 2024-02-04 VITALS — BP 157/81 | HR 63 | Temp 98.3°F | Ht 70.0 in | Wt 210.0 lb

## 2024-02-04 DIAGNOSIS — I10 Essential (primary) hypertension: Secondary | ICD-10-CM

## 2024-02-04 DIAGNOSIS — E559 Vitamin D deficiency, unspecified: Secondary | ICD-10-CM

## 2024-02-04 DIAGNOSIS — E669 Obesity, unspecified: Secondary | ICD-10-CM

## 2024-02-04 DIAGNOSIS — Z6830 Body mass index (BMI) 30.0-30.9, adult: Secondary | ICD-10-CM | POA: Diagnosis not present

## 2024-02-04 MED ORDER — VITAMIN D (ERGOCALCIFEROL) 1.25 MG (50000 UNIT) PO CAPS: 50000.0000 [IU] | ORAL_CAPSULE | ORAL | 0 refills | Status: DC

## 2024-02-04 NOTE — Progress Notes (Signed)
 Office: [phone]  /  Fax: [phone]  WEIGHT SUMMARY AND BIOMETRICS  Anthropometric Measurements Height: 5\' 10"  (1.778 m) Weight: 210 lb (95.3 kg) BMI (Calculated): 30.13 Weight at Last Visit: 213 lb Weight Lost Since Last Visit: 3 lb Weight Gained Since Last Visit: 0 Starting Weight: 213 lb Total Weight Loss (lbs): 3 lb (1.361 kg) Peak Weight: 255 lb   Body Composition  Body Fat %: 24.7 % Fat Mass (lbs): 52 lbs Muscle Mass (lbs): 151 lbs Total Body Water (lbs): 114.4 lbs Visceral Fat Rating : 8   Other Clinical Data Fasting: No Labs: No Today's Visit #: 3 Starting Date: 12/31/23    Chief Complaint: OBESITY    History of Present Illness Glen Sullivan is a 23 year old male with obesity who presents for obesity treatment plan assessment and progress evaluation.  He has been adhering to his prescribed category four eating plan approximately seventy-five percent of the time and has incorporated exercise, including gym workouts and dancing, for thirty minutes once or twice a week. Over the last three weeks, he has lost three pounds. Hunger levels have been manageable for the past couple of weeks, although he experiences increased hunger during dinner time, especially when he has activities planned after eating. He often feels the need to have something sweet after dinner. He is working on improving his breakfast routine, experimenting with different protein sources and seasoning to keep meals interesting.  He has a history of vitamin D  deficiency and is currently on prescription vitamin D . He requested a refill during this visit.  He is managing hypertension and is on 50 mg of Losartan. He forgot to take his dose this morning, which may have contributed to his elevated blood pressure readings today.      PHYSICAL EXAM:  Blood pressure (!) 157/81, pulse 63, temperature 98.3 F (36.8 C), height 5\' 10"  (1.778 m), weight 210 lb (95.3 kg), SpO2 97%. Body  mass index is 30.13 kg/m.  DIAGNOSTIC DATA REVIEWED:  BMET    Component Value Date/Time   NA 140 12/31/2023 0833   K 4.2 12/31/2023 0833   CL 100 12/31/2023 0833   CO2 23 12/31/2023 0833   GLUCOSE 86 12/31/2023 0833   GLUCOSE 162 (H) 06/26/2022 0453   BUN 10 12/31/2023 0833   CREATININE 0.89 12/31/2023 0833   CALCIUM 9.4 12/31/2023 0833   GFRNONAA >60 06/26/2022 0453   Lab Results  Component Value Date   HGBA1C 5.0 12/31/2023   Lab Results  Component Value Date   INSULIN  6.5 12/31/2023   Lab Results  Component Value Date   TSH 1.600 12/31/2023   CBC    Component Value Date/Time   WBC 4.1 12/31/2023 0833   WBC 12.7 (H) 06/26/2022 0453   RBC 4.78 12/31/2023 0833   RBC 4.57 06/26/2022 0453   HGB 14.7 12/31/2023 0833   HCT 43.0 12/31/2023 0833   PLT 151 12/31/2023 0833   MCV 90 12/31/2023 0833   MCH 30.8 12/31/2023 0833   MCH 30.4 06/26/2022 0453   MCHC 34.2 12/31/2023 0833   MCHC 34.8 06/26/2022 0453   RDW 12.5 12/31/2023 0833   Iron Studies No results found for: "IRON", "TIBC", "FERRITIN", "IRONPCTSAT" Lipid Panel     Component Value Date/Time   CHOL 146 12/31/2023 0833   TRIG 75 12/31/2023 0833   HDL 47 12/31/2023 0833   LDLCALC 84 12/31/2023 0833   Hepatic Function Panel     Component Value Date/Time   PROT 6.7 12/31/2023  1610   ALBUMIN 4.7 12/31/2023 0833   AST 19 12/31/2023 0833   ALT 14 12/31/2023 0833   ALKPHOS 58 12/31/2023 0833   BILITOT 0.8 12/31/2023 0833      Component Value Date/Time   TSH 1.600 12/31/2023 0833   Nutritional Lab Results  Component Value Date   VD25OH 31.9 12/31/2023     Assessment and Plan Assessment & Plan Hypertension Hypertension with current blood pressure readings of 153/89 mmHg and 157/81 mmHg. He is on 50 mg of Losartan. Missed dose today, which may have contributed to elevated readings. Discussed the importance of medication adherence to prevent sporadic elevated blood pressures that can affect  cardiovascular health. - Continue Losartan 50 mg daily. - Implement strategies to improve medication adherence, such as using a pill box. - Monitor blood pressure at home regularly.  Obesity Obesity management with a focus on dietary modifications and exercise. He has lost 3 pounds in the last three weeks. Following a category four eating plan 75% of the time and exercising 30 minutes once or twice a week. Discussed challenges with hunger, especially in the evenings, and strategies to manage it. Discussed the role of protein in diet and the impact of protein supplements on weight loss, noting that reliance on protein supplements can halt weight loss due to lack of calorie burning during digestion. - Continue category four eating plan. - Maintain exercise routine of 30 minutes once or twice a week. - Consider alternative breakfast options to increase protein intake without relying on protein supplements. - Incorporate spices and low-calorie seasonings to enhance meal variety.  Vitamin D  deficiency Vitamin D  deficiency managed with prescription vitamin D . He requested a refill today. - Refill prescription vitamin D .      He was informed of the importance of frequent follow up visits to maximize his success with intensive lifestyle modifications for his multiple health conditions.    Jasmine Mesi, MD

## 2024-03-04 ENCOUNTER — Other Ambulatory Visit (HOSPITAL_COMMUNITY): Payer: Self-pay

## 2024-03-04 ENCOUNTER — Ambulatory Visit (INDEPENDENT_AMBULATORY_CARE_PROVIDER_SITE_OTHER): Admitting: Family Medicine

## 2024-03-04 ENCOUNTER — Encounter (INDEPENDENT_AMBULATORY_CARE_PROVIDER_SITE_OTHER): Payer: Self-pay | Admitting: Family Medicine

## 2024-03-04 VITALS — BP 123/72 | HR 71 | Temp 98.2°F | Ht 70.0 in | Wt 210.0 lb

## 2024-03-04 DIAGNOSIS — E559 Vitamin D deficiency, unspecified: Secondary | ICD-10-CM

## 2024-03-04 DIAGNOSIS — I10 Essential (primary) hypertension: Secondary | ICD-10-CM

## 2024-03-04 DIAGNOSIS — E669 Obesity, unspecified: Secondary | ICD-10-CM

## 2024-03-04 DIAGNOSIS — E66811 Obesity, class 1: Secondary | ICD-10-CM

## 2024-03-04 DIAGNOSIS — Z6830 Body mass index (BMI) 30.0-30.9, adult: Secondary | ICD-10-CM

## 2024-03-04 MED ORDER — VITAMIN D (ERGOCALCIFEROL) 1.25 MG (50000 UNIT) PO CAPS
50000.0000 [IU] | ORAL_CAPSULE | ORAL | 0 refills | Status: DC
  Filled 2024-03-04: qty 5, 35d supply, fill #0

## 2024-03-04 NOTE — Progress Notes (Signed)
 Office: [phone]  /  Fax: [phone]  WEIGHT SUMMARY AND BIOMETRICS  Anthropometric Measurements Height: 5\' 10"  (1.778 m) Weight: 210 lb (95.3 kg) BMI (Calculated): 30.13 Weight at Last Visit: 210 lb Weight Lost Since Last Visit: 0 Weight Gained Since Last Visit: 0 Starting Weight: 213 lb Total Weight Loss (lbs): 3 lb (1.361 kg) Peak Weight: 255 lb   Body Composition  Body Fat %: 25.2 % Fat Mass (lbs): 53 lbs Muscle Mass (lbs): 149.4 lbs Total Body Water (lbs): 113.2 lbs Visceral Fat Rating : 8   Other Clinical Data Fasting: no Labs: no Today's Visit #: 4 Starting Date: 12/31/23    Chief Complaint: OBESITY    History of Present Illness Glen Sullivan is a 23 year old male who presents for obesity treatment.  He has been prescribed a category four eating plan but adheres to it only about ten percent of the time. He focuses more on exercise, engaging in activities such as martial arts, dancing classes, and working with a Systems analyst. He exercises five to six days a week and has maintained his weight since the last visit.  He reports a recent increase in cravings and poor adherence to the meal plan this month. He often experiences a 'sweet tooth' and tends to eat cereal at night. He struggles with meal prep due to a busy schedule, particularly noting a lack of time during a recent weekend trip to [place]. Despite these challenges, he has managed to maintain his weight.  He is currently taking losartan 50 mg daily and vitamin D  50,000 IU weekly. He has not been able to consistently pick up his vitamin D  prescription due to timing issues with the pharmacy. He feels tired in the mornings and has become a regular coffee drinker to cope with fatigue.  He keeps his losartan by his bed to help remember to take it, but is considering leaving it at work to ensure he takes it regularly. He notes that he has not taken vitamin D  since running out of his  prescription.      PHYSICAL EXAM:  Blood pressure 123/72, pulse 71, temperature 98.2 F (36.8 C), height 5\' 10"  (1.778 m), weight 210 lb (95.3 kg), SpO2 97%. Body mass index is 30.13 kg/m.  DIAGNOSTIC DATA REVIEWED:  BMET    Component Value Date/Time   NA 140 12/31/2023 0833   K 4.2 12/31/2023 0833   CL 100 12/31/2023 0833   CO2 23 12/31/2023 0833   GLUCOSE 86 12/31/2023 0833   GLUCOSE 162 (H) 06/26/2022 0453   BUN 10 12/31/2023 0833   CREATININE 0.89 12/31/2023 0833   CALCIUM 9.4 12/31/2023 0833   GFRNONAA >60 06/26/2022 0453   Lab Results  Component Value Date   HGBA1C 5.0 12/31/2023   Lab Results  Component Value Date   INSULIN  6.5 12/31/2023   Lab Results  Component Value Date   TSH 1.600 12/31/2023   CBC    Component Value Date/Time   WBC 4.1 12/31/2023 0833   WBC 12.7 (H) 06/26/2022 0453   RBC 4.78 12/31/2023 0833   RBC 4.57 06/26/2022 0453   HGB 14.7 12/31/2023 0833   HCT 43.0 12/31/2023 0833   PLT 151 12/31/2023 0833   MCV 90 12/31/2023 0833   MCH 30.8 12/31/2023 0833   MCH 30.4 06/26/2022 0453   MCHC 34.2 12/31/2023 0833   MCHC 34.8 06/26/2022 0453   RDW 12.5 12/31/2023 0833   Iron Studies No results found for: "IRON", "TIBC", "FERRITIN", "IRONPCTSAT"  Lipid Panel     Component Value Date/Time   CHOL 146 12/31/2023 0833   TRIG 75 12/31/2023 0833   HDL 47 12/31/2023 0833   LDLCALC 84 12/31/2023 0833   Hepatic Function Panel     Component Value Date/Time   PROT 6.7 12/31/2023 0833   ALBUMIN 4.7 12/31/2023 0833   AST 19 12/31/2023 0833   ALT 14 12/31/2023 0833   ALKPHOS 58 12/31/2023 0833   BILITOT 0.8 12/31/2023 0833      Component Value Date/Time   TSH 1.600 12/31/2023 1610   Nutritional Lab Results  Component Value Date   VD25OH 31.9 12/31/2023     Assessment and Plan Assessment & Plan Obesity He manages obesity with a category four eating plan, adhering 10% of the time. He exercises regularly, engaging in martial arts,  dancing, and personal training 5-6 days a week. Despite limited dietary adherence, he has maintained his weight since the last visit. Challenges with self-discipline and meal preparation impact dietary adherence. Strategies for meal preparation and planning were discussed to improve adherence. - Continue current exercise regimen - Encourage adherence to the prescribed eating plan - Implement strategies for meal preparation and planning to improve dietary adherence  Hypertension Blood pressure is well-controlled at 123/72 mmHg on losartan 50 mg daily, which he takes regularly. He has developed a routine to remember his medication, keeping it beside his bed. Discussed alternative strategies to improve medication adherence, such as keeping medication at work. - Continue losartan 50 mg daily - Consider keeping medication at work to improve adherence  Vitamin D  Deficiency He has vitamin D  deficiency and is prescribed vitamin D  50,000 IU weekly. Challenges in obtaining medication due to pharmacy logistics have led to missed doses. Discussed the option of pharmacy delivery to improve access. - Refill vitamin D  50,000 IU weekly prescription to [facility] pharmacy for easier access - Consider pharmacy delivery to ensure timely access to medication  Follow-up He has a follow-up appointment scheduled for July 2nd at 4 PM and expressed interest in scheduling an additional appointment in August. - Confirm follow-up appointment on July 2nd at 4 PM - Schedule an additional follow-up appointment in August      He was informed of the importance of frequent follow up visits to maximize his success with intensive lifestyle modifications for his multiple health conditions.    Jasmine Mesi, MD

## 2024-03-05 ENCOUNTER — Other Ambulatory Visit (HOSPITAL_COMMUNITY): Payer: Self-pay

## 2024-03-05 ENCOUNTER — Other Ambulatory Visit: Payer: Self-pay

## 2024-03-05 MED ORDER — ERGOCALCIFEROL 1.25 MG (50000 UT) PO CAPS
1.0000 | ORAL_CAPSULE | ORAL | 0 refills | Status: DC
  Filled 2024-03-05 (×2): qty 5, 35d supply, fill #0

## 2024-03-06 ENCOUNTER — Other Ambulatory Visit (HOSPITAL_COMMUNITY): Payer: Self-pay

## 2024-04-01 ENCOUNTER — Ambulatory Visit (INDEPENDENT_AMBULATORY_CARE_PROVIDER_SITE_OTHER): Admitting: Family Medicine

## 2024-04-01 ENCOUNTER — Encounter (INDEPENDENT_AMBULATORY_CARE_PROVIDER_SITE_OTHER): Payer: Self-pay | Admitting: Family Medicine

## 2024-04-01 ENCOUNTER — Other Ambulatory Visit (HOSPITAL_COMMUNITY): Payer: Self-pay

## 2024-04-01 VITALS — BP 137/87 | HR 57 | Temp 98.0°F | Ht 70.0 in | Wt 215.0 lb

## 2024-04-01 DIAGNOSIS — E669 Obesity, unspecified: Secondary | ICD-10-CM

## 2024-04-01 DIAGNOSIS — E559 Vitamin D deficiency, unspecified: Secondary | ICD-10-CM

## 2024-04-01 DIAGNOSIS — I10 Essential (primary) hypertension: Secondary | ICD-10-CM | POA: Diagnosis not present

## 2024-04-01 DIAGNOSIS — Z6830 Body mass index (BMI) 30.0-30.9, adult: Secondary | ICD-10-CM

## 2024-04-01 MED ORDER — VITAMIN D (ERGOCALCIFEROL) 1.25 MG (50000 UNIT) PO CAPS
50000.0000 [IU] | ORAL_CAPSULE | ORAL | 0 refills | Status: DC
  Filled 2024-04-01: qty 5, 35d supply, fill #0

## 2024-04-01 NOTE — Progress Notes (Signed)
 Office: [phone]  /  Fax: [phone]  WEIGHT SUMMARY AND BIOMETRICS  Anthropometric Measurements Height: 5' 10 (1.778 m) Weight: 215 lb (97.5 kg) BMI (Calculated): 30.85 Weight at Last Visit: 210 lb Weight Lost Since Last Visit: 0 Weight Gained Since Last Visit: 5 lb Starting Weight: 213 lb Total Weight Loss (lbs): 0 lb (0 kg) Peak Weight: 255 lb   Body Composition  Body Fat %: 27.1 % Fat Mass (lbs): 58.4 lbs Muscle Mass (lbs): 149.4 lbs Total Body Water (lbs): 115.6 lbs Visceral Fat Rating : 9   Other Clinical Data Fasting: No Labs: No Today's Visit #: 5 Starting Date: 12/31/23    Chief Complaint: OBESITY   Discussed the use of AI scribe software for clinical note transcription with the patient, who gave verbal consent to proceed.  History of Present Illness Glen Sullivan is a 23 year old male with obesity and hypertension who presents for follow-up on his obesity treatment plan.  He adheres to his prescribed category four eating plan only about five percent of the time. Despite engaging in 30 to 60 minutes of strength and cardio exercise two to three times a week, he continues to gain weight, with a five-pound increase over the last month. He faces challenges with motivation and meal planning, often feeling overwhelmed by tasks and struggling to commit to cooking and meal preparation. He experiences difficulty with late-night cravings, particularly for sweet foods after savory meals.  He has a history of hypertension and is currently taking losartan. His initial blood pressure reading was elevated at 153/65, but a repeat measurement showed improvement to 137/87.  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 He uses coffee to stay awake on training days and sometimes consumes a pastry, which he notes does not keep him full, likely due to low protein  intake.  He has been trying to learn [place] but finds it challenging to maintain focus and consistency in his studies, which he attributes to a possible focus issue.      PHYSICAL EXAM:  Blood pressure 137/87, pulse (!) 57, temperature 98 F (36.7 C), height 5' 10 (1.778 m), weight 215 lb (97.5 kg), SpO2 99%. Body mass index is 30.85 kg/m.  DIAGNOSTIC DATA REVIEWED:  BMET    Component Value Date/Time   NA 140 12/31/2023 0833   K 4.2 12/31/2023 0833   CL 100 12/31/2023 0833   CO2 23 12/31/2023 0833   GLUCOSE 86 12/31/2023 0833   GLUCOSE 162 (H) 06/26/2022 0453   BUN 10 12/31/2023 0833   CREATININE 0.89 12/31/2023 0833   CALCIUM 9.4 12/31/2023 0833   GFRNONAA >60 06/26/2022 0453   Lab Results  Component Value Date   HGBA1C 5.0 12/31/2023   Lab Results  Component Value Date   INSULIN  6.5 12/31/2023   Lab Results  Component Value Date   TSH 1.600 12/31/2023   CBC    Component Value Date/Time   WBC 4.1 12/31/2023 0833   WBC 12.7 (H) 06/26/2022 0453   RBC 4.78 12/31/2023 0833   RBC 4.57 06/26/2022 0453   HGB 14.7 12/31/2023 0833   HCT 43.0 12/31/2023 0833   PLT 151 12/31/2023 0833   MCV 90 12/31/2023 0833   MCH 30.8 12/31/2023 0833   MCH 30.4 06/26/2022 0453   MCHC 34.2 12/31/2023 0833   MCHC 34.8 06/26/2022 0453   RDW 12.5 12/31/2023 9166  Iron Studies No results found for: IRON, TIBC, FERRITIN, IRONPCTSAT Lipid Panel     Component Value Date/Time   CHOL 146 12/31/2023 0833   TRIG 75 12/31/2023 0833   HDL 47 12/31/2023 0833   LDLCALC 84 12/31/2023 0833   Hepatic Function Panel     Component Value Date/Time   PROT 6.7 12/31/2023 0833   ALBUMIN 4.7 12/31/2023 0833   AST 19 12/31/2023 0833   ALT 14 12/31/2023 0833   ALKPHOS 58 12/31/2023 0833   BILITOT 0.8 12/31/2023 0833      Component Value Date/Time   TSH 1.600 12/31/2023 9166   Nutritional Lab Results  Component Value Date   VD25OH 31.9 12/31/2023     Assessment and  Plan Assessment & Plan Obesity He follows the category four eating plan only 5% of the time and has gained 5 pounds over the last month. He exercises 30 to 60 minutes of strength and cardio two to three times a week. Adherence to the eating plan is challenging due to lack of motivation, difficulty with meal planning and preparation, cravings at night, and difficulty maintaining focus on tasks. Discussed the stages of change and emphasized the importance of establishing a routine starting with breakfast. Considered the potential benefit of a journaling plan for meal tracking. - Develop strategies to improve adherence to the eating plan. - Establish a routine starting with breakfast. - Continue the Category 4 plan - Maintain current exercise regimen.  Hypertension He is on losartan. Initial blood pressure was elevated at 153/65, improving to 137/87 on repeat measurement. Weight loss is emphasized as a key component in managing hypertension. Discussed the potential impact of inadequate sleep on concentration and irritability, which may indirectly affect blood pressure management. - Continue losartan for blood pressure management. - Focus on diet, exercise, and weight loss.  Vitamin D  deficiency He requires vitamin D  supplementation. - Refill vitamin D  50,000 IU per week.    He was informed of the importance of frequent follow up visits to maximize his success with intensive lifestyle modifications for his multiple health conditions.    [name], MD

## 2024-05-06 ENCOUNTER — Other Ambulatory Visit (HOSPITAL_COMMUNITY): Payer: Self-pay

## 2024-05-06 ENCOUNTER — Encounter (INDEPENDENT_AMBULATORY_CARE_PROVIDER_SITE_OTHER): Payer: Self-pay | Admitting: Family Medicine

## 2024-05-06 ENCOUNTER — Ambulatory Visit (INDEPENDENT_AMBULATORY_CARE_PROVIDER_SITE_OTHER): Admitting: Family Medicine

## 2024-05-06 VITALS — BP 132/77 | HR 81 | Temp 98.7°F | Ht 70.0 in | Wt 212.0 lb

## 2024-05-06 DIAGNOSIS — E669 Obesity, unspecified: Secondary | ICD-10-CM | POA: Diagnosis not present

## 2024-05-06 DIAGNOSIS — E559 Vitamin D deficiency, unspecified: Secondary | ICD-10-CM

## 2024-05-06 DIAGNOSIS — Z6830 Body mass index (BMI) 30.0-30.9, adult: Secondary | ICD-10-CM | POA: Diagnosis not present

## 2024-05-06 MED ORDER — ERGOCALCIFEROL 1.25 MG (50000 UT) PO CAPS: 1.0000 | ORAL_CAPSULE | ORAL | 0 refills | Status: DC

## 2024-05-06 MED ORDER — ERGOCALCIFEROL 1.25 MG (50000 UT) PO CAPS
1.0000 | ORAL_CAPSULE | ORAL | 0 refills | Status: DC
  Filled 2024-05-06: qty 5, 35d supply, fill #0

## 2024-05-06 NOTE — Progress Notes (Signed)
 Office: [phone]  /  Fax: [phone]  WEIGHT SUMMARY AND BIOMETRICS  Anthropometric Measurements Height: 5' 10 (1.778 m) Weight: 212 lb (96.2 kg) BMI (Calculated): 30.42 Weight at Last Visit: 215lb Weight Lost Since Last Visit: 3lb Weight Gained Since Last Visit: 0lb Starting Weight: 213lb Total Weight Loss (lbs): 1 lb (0.454 kg) Peak Weight: 255lb   Body Composition  Body Fat %: 25.4 % Fat Mass (lbs): 53.8 lbs Muscle Mass (lbs): 150.6 lbs Total Body Water (lbs): 111.8 lbs Visceral Fat Rating : 10   Other Clinical Data Fasting: No Labs: no Today's Visit #: 6 Starting Date: 12/31/23    Chief Complaint: OBESITY   History of Present Illness Glen Sullivan is a 23 year old male who presents for a follow-up on his obesity treatment plan.  He is following a category three eating plan for obesity treatment, adhering to it only five percent of the time. He exercises two days a week for thirty minutes each session. Since his last visit a month ago, he has lost three pounds, totaling a weight loss of one pound since starting the plan in April.  He experiences 'normal' hunger levels, with afternoons being the most challenging time. He reports that anxiety can trigger his urge to snack, especially in the afternoons, and that he sometimes eats due to stress or boredom rather than actual hunger.  He is being treated for vitamin D  deficiency and is taking prescription vitamin D  at a dose of fifty thousand international units per week. No issues with this medication are reported, and he is due for a refill.  He mentions difficulty with meal planning, finding it challenging to allocate time for it and often perceiving it as a chore. He has not yet tried [employer]. No skipping meals is reported.      PHYSICAL EXAM:  Blood pressure 132/77, pulse 81, temperature 98.7 F (37.1 C), height 5' 10 (1.778 m), weight 212 lb (96.2 kg), SpO2 99%. Body mass  index is 30.42 kg/m.  DIAGNOSTIC DATA REVIEWED:  BMET    Component Value Date/Time   NA 140 12/31/2023 0833   K 4.2 12/31/2023 0833   CL 100 12/31/2023 0833   CO2 23 12/31/2023 0833   GLUCOSE 86 12/31/2023 0833   GLUCOSE 162 (H) 06/26/2022 0453   BUN 10 12/31/2023 0833   CREATININE 0.89 12/31/2023 0833   CALCIUM 9.4 12/31/2023 0833   GFRNONAA >60 06/26/2022 0453   Lab Results  Component Value Date   HGBA1C 5.0 12/31/2023   Lab Results  Component Value Date   INSULIN  6.5 12/31/2023   Lab Results  Component Value Date   TSH 1.600 12/31/2023   CBC    Component Value Date/Time   WBC 4.1 12/31/2023 0833   WBC 12.7 (H) 06/26/2022 0453   RBC 4.78 12/31/2023 0833   RBC 4.57 06/26/2022 0453   HGB 14.7 12/31/2023 0833   HCT 43.0 12/31/2023 0833   PLT 151 12/31/2023 0833   MCV 90 12/31/2023 0833   MCH 30.8 12/31/2023 0833   MCH 30.4 06/26/2022 0453   MCHC 34.2 12/31/2023 0833   MCHC 34.8 06/26/2022 0453   RDW 12.5 12/31/2023 0833   Iron Studies No results found for: IRON, TIBC, FERRITIN, IRONPCTSAT Lipid Panel     Component Value Date/Time   CHOL 146 12/31/2023 0833   TRIG 75 12/31/2023 0833   HDL 47 12/31/2023 0833   LDLCALC 84 12/31/2023 0833   Hepatic Function Panel  Component Value Date/Time   PROT 6.7 12/31/2023 0833   ALBUMIN 4.7 12/31/2023 0833   AST 19 12/31/2023 0833   ALT 14 12/31/2023 0833   ALKPHOS 58 12/31/2023 0833   BILITOT 0.8 12/31/2023 0833      Component Value Date/Time   TSH 1.600 12/31/2023 9166   Nutritional Lab Results  Component Value Date   VD25OH 31.9 12/31/2023     Assessment and Plan Assessment & Plan Obesity Obesity management with category three eating plan and exercise regimen. Weight loss of three pounds over the last month, total weight loss of one pound since April. Neurologic hunger identified, associated with stress and boredom. Prefers to avoid medication for hunger management. - Continue category  three eating plan. - Encourage exercise at least two days a week for 30 minutes. - Advise mindfulness techniques to manage neurologic hunger. - Discuss potential use of medications for hunger management if needed in future visits. - Work on meal planning and regular grocery shopping  Vitamin D  deficiency Vitamin D  deficiency managed with prescription vitamin D , 50,000 IU weekly. No reported issues with current regimen. Requires prescription refill. Prescription was [facility] but needs to be redirected to [facility] for delivery. - Refill prescription for vitamin D , 50,000 IU weekly. - Ensure prescription is sent to [facility] for delivery.      He was informed of the importance of frequent follow up visits to maximize his success with intensive lifestyle modifications for his multiple health conditions.    [name], MD

## 2024-05-14 ENCOUNTER — Other Ambulatory Visit: Payer: Self-pay

## 2024-06-02 ENCOUNTER — Other Ambulatory Visit: Payer: Self-pay

## 2024-06-02 ENCOUNTER — Other Ambulatory Visit (HOSPITAL_COMMUNITY): Payer: Self-pay

## 2024-06-02 MED ORDER — LOSARTAN POTASSIUM 50 MG PO TABS
50.0000 mg | ORAL_TABLET | Freq: Every day | ORAL | 1 refills | Status: AC
  Filled 2024-06-02 (×2): qty 90, 90d supply, fill #0

## 2024-06-03 ENCOUNTER — Ambulatory Visit (INDEPENDENT_AMBULATORY_CARE_PROVIDER_SITE_OTHER): Admitting: Family Medicine

## 2024-06-03 ENCOUNTER — Encounter (INDEPENDENT_AMBULATORY_CARE_PROVIDER_SITE_OTHER): Payer: Self-pay | Admitting: Family Medicine

## 2024-06-03 ENCOUNTER — Other Ambulatory Visit (HOSPITAL_COMMUNITY): Payer: Self-pay

## 2024-06-03 VITALS — BP 158/78 | HR 69 | Temp 97.9°F | Ht 70.0 in | Wt 219.0 lb

## 2024-06-03 DIAGNOSIS — Z6831 Body mass index (BMI) 31.0-31.9, adult: Secondary | ICD-10-CM

## 2024-06-03 DIAGNOSIS — I10 Essential (primary) hypertension: Secondary | ICD-10-CM

## 2024-06-03 DIAGNOSIS — E669 Obesity, unspecified: Secondary | ICD-10-CM

## 2024-06-03 DIAGNOSIS — E559 Vitamin D deficiency, unspecified: Secondary | ICD-10-CM | POA: Diagnosis not present

## 2024-06-03 DIAGNOSIS — E538 Deficiency of other specified B group vitamins: Secondary | ICD-10-CM

## 2024-06-03 DIAGNOSIS — Z6830 Body mass index (BMI) 30.0-30.9, adult: Secondary | ICD-10-CM

## 2024-06-03 MED ORDER — ERGOCALCIFEROL 1.25 MG (50000 UT) PO CAPS
1.0000 | ORAL_CAPSULE | ORAL | 0 refills | Status: DC
  Filled 2024-06-03: qty 5, 35d supply, fill #0

## 2024-06-03 NOTE — Progress Notes (Deleted)
   Office: [phone]  /  Fax: [phone]  WEIGHT SUMMARY AND BIOMETRICS  No data recorded No data recorded No data recorded  Chief Complaint: OBESITY   Discussed the use of AI scribe software for clinical note transcription with the patient, who gave verbal consent to proceed.  History of Present Illness       PHYSICAL EXAM:  There were no vitals taken for this visit. There is no height or weight on file to calculate BMI.  DIAGNOSTIC DATA REVIEWED:  BMET    Component Value Date/Time   NA 140 12/31/2023 0833   K 4.2 12/31/2023 0833   CL 100 12/31/2023 0833   CO2 23 12/31/2023 0833   GLUCOSE 86 12/31/2023 0833   GLUCOSE 162 (H) 06/26/2022 0453   BUN 10 12/31/2023 0833   CREATININE 0.89 12/31/2023 0833   CALCIUM 9.4 12/31/2023 0833   GFRNONAA >60 06/26/2022 0453   Lab Results  Component Value Date   HGBA1C 5.0 12/31/2023   Lab Results  Component Value Date   INSULIN  6.5 12/31/2023   Lab Results  Component Value Date   TSH 1.600 12/31/2023   CBC    Component Value Date/Time   WBC 4.1 12/31/2023 0833   WBC 12.7 (H) 06/26/2022 0453   RBC 4.78 12/31/2023 0833   RBC 4.57 06/26/2022 0453   HGB 14.7 12/31/2023 0833   HCT 43.0 12/31/2023 0833   PLT 151 12/31/2023 0833   MCV 90 12/31/2023 0833   MCH 30.8 12/31/2023 0833   MCH 30.4 06/26/2022 0453   MCHC 34.2 12/31/2023 0833   MCHC 34.8 06/26/2022 0453   RDW 12.5 12/31/2023 0833   Iron Studies No results found for: IRON, TIBC, FERRITIN, IRONPCTSAT Lipid Panel     Component Value Date/Time   CHOL 146 12/31/2023 0833   TRIG 75 12/31/2023 0833   HDL 47 12/31/2023 0833   LDLCALC 84 12/31/2023 0833   Hepatic Function Panel     Component Value Date/Time   PROT 6.7 12/31/2023 0833   ALBUMIN 4.7 12/31/2023 0833   AST 19 12/31/2023 0833   ALT 14 12/31/2023 0833   ALKPHOS 58 12/31/2023 0833   BILITOT 0.8 12/31/2023 0833      Component Value Date/Time   TSH 1.600 12/31/2023 0833    Nutritional Lab Results  Component Value Date   VD25OH 31.9 12/31/2023     Assessment and Plan Assessment & Plan      I personally spent a total of *** minutes in the care of the patient today including {cbtimebasedcoding:[id number]}.    Eman was informed of the importance of frequent follow up visits to maximize his success with intensive lifestyle modifications for his obesity and obesity related health conditions as recommended by USPSTF and CMS guidelines   [name], MD

## 2024-06-03 NOTE — Progress Notes (Signed)
 Office: [phone]  /  Fax: [phone]  WEIGHT SUMMARY AND BIOMETRICS  Anthropometric Measurements Height: 5' 10 (1.778 m) Weight: 219 lb (99.3 kg) BMI (Calculated): 31.42 Weight at Last Visit: 212 lb Weight Lost Since Last Visit: 0 Weight Gained Since Last Visit: 7 lb Starting Weight: 213 lb Total Weight Loss (lbs): 0 lb (0 kg) Peak Weight: 255 lb   Body Composition  Body Fat %: 26.5 % Fat Mass (lbs): 58 lbs Muscle Mass (lbs): 153 lbs Total Body Water (lbs): 116 lbs Visceral Fat Rating : 9   Other Clinical Data Fasting: no Labs: no Today's Visit #: 7 Starting Date: 12/31/23    Chief Complaint: OBESITY    History of Present Illness Glen Sullivan is a 23 year old male with obesity who presents for obesity treatment and progress assessment.  He has been prescribed a category three eating plan but has struggled to adhere to it, opting instead for his own portion control and smart choices eating plan. He has gained seven pounds over the last month. He exercises two days a week for thirty minutes, focusing on cardio and strengthening exercises. He attempts to get seven to nine hours of sleep per night and drinks between 64 to 128 ounces of water daily. However, he is not tracking calories or macros and frequently skips meals.  He is being treated for vitamin D  deficiency with ergocalciferol , 50,000 international units per week, and requests a refill. His most recent vitamin D  level was low at 31.9 ng/mL, measured in April 2025.  He also has hypertension and is treated with losartan . He has run out of his medication, which he recently ordered but has not yet received.  Hid last B12 level was below ideal and he is trying to improve his diet to fix this.      PHYSICAL EXAM:  Blood pressure (!) 158/78, pulse 69, temperature 97.9 F (36.6 C), height 5' 10 (1.778 m), weight 219 lb (99.3 kg), SpO2 99%. Body mass index is 31.42 kg/m.  DIAGNOSTIC DATA  REVIEWED:  BMET    Component Value Date/Time   NA 140 12/31/2023 0833   K 4.2 12/31/2023 0833   CL 100 12/31/2023 0833   CO2 23 12/31/2023 0833   GLUCOSE 86 12/31/2023 0833   GLUCOSE 162 (H) 06/26/2022 0453   BUN 10 12/31/2023 0833   CREATININE 0.89 12/31/2023 0833   CALCIUM 9.4 12/31/2023 0833   GFRNONAA >60 06/26/2022 0453   Lab Results  Component Value Date   HGBA1C 5.0 12/31/2023   Lab Results  Component Value Date   INSULIN  6.5 12/31/2023   Lab Results  Component Value Date   TSH 1.600 12/31/2023   CBC    Component Value Date/Time   WBC 4.1 12/31/2023 0833   WBC 12.7 (H) 06/26/2022 0453   RBC 4.78 12/31/2023 0833   RBC 4.57 06/26/2022 0453   HGB 14.7 12/31/2023 0833   HCT 43.0 12/31/2023 0833   PLT 151 12/31/2023 0833   MCV 90 12/31/2023 0833   MCH 30.8 12/31/2023 0833   MCH 30.4 06/26/2022 0453   MCHC 34.2 12/31/2023 0833   MCHC 34.8 06/26/2022 0453   RDW 12.5 12/31/2023 0833   Iron Studies No results found for: IRON, TIBC, FERRITIN, IRONPCTSAT Lipid Panel     Component Value Date/Time   CHOL 146 12/31/2023 0833   TRIG 75 12/31/2023 0833   HDL 47 12/31/2023 0833   LDLCALC 84 12/31/2023 0833   Hepatic Function Panel  Component Value Date/Time   PROT 6.7 12/31/2023 0833   ALBUMIN 4.7 12/31/2023 0833   AST 19 12/31/2023 0833   ALT 14 12/31/2023 0833   ALKPHOS 58 12/31/2023 0833   BILITOT 0.8 12/31/2023 0833      Component Value Date/Time   TSH 1.600 12/31/2023 0833   Nutritional Lab Results  Component Value Date   VD25OH 31.9 12/31/2023     Assessment and Plan Assessment & Plan Obesity Obesity with a weight gain of seven pounds over the last month, now exceeding starting weight. Struggles with adherence to prescribed category three eating plan, opting for self-directed portion control and smart choices plan. Engages in exercise two days a week for thirty minutes, including cardio and strengthening. Not tracking calories or  macros and frequently skipping meals. Discussed alternative low carb eating plan, which allows unlimited consumption of listed foods and requires eating at least three times a day. Emphasized importance of hydration to prevent dehydration-related headaches. - Implement low carb eating plan with unlimited consumption of listed foods and mandatory three meals a day. - Increase exercise to three days a week, aiming for 150 minutes per week. - Encourage hydration to prevent dehydration-related headaches.  Essential hypertension Essential hypertension with elevated blood pressure readings of 150/78 and 158/78. Currently on losartan , but has run out of medication. Medication is in the process of being delivered via mail. - Ensure receipt of losartan  medication via mail delivery. - Monitor blood pressure once medication is received.  Vitamin D  deficiency Vitamin D  deficiency with recent level of 31.9 ng/mL as of April. Currently on ergocalciferol  50,000 IU weekly. Requires refill of medication. - Refill ergocalciferol  50,000 IU weekly. - Order repeat vitamin D  level.  Deficiency of vitamin B12 Deficiency of vitamin B12 with previous low levels. Last B12 level was below 500. Lean meats are a dietary source of B12, which may help improve levels. - Order repeat vitamin B12 level.  Follow up in 4 weeks   Iniko was informed of the importance of frequent follow up visits to maximize his success with intensive lifestyle modifications for his obesity and obesity related health conditions as recommended by USPSTF and CMS guidelines   [name], MD

## 2024-06-17 ENCOUNTER — Encounter: Payer: Self-pay | Admitting: General Surgery

## 2024-06-22 ENCOUNTER — Other Ambulatory Visit: Payer: Self-pay | Admitting: Family

## 2024-06-22 ENCOUNTER — Ambulatory Visit
Admission: RE | Admit: 2024-06-22 | Discharge: 2024-06-22 | Disposition: A | Discharge status: Home / Self Care | Source: Ambulatory Visit | Attending: Family | Admitting: Family

## 2024-06-22 DIAGNOSIS — G8929 Other chronic pain: Secondary | ICD-10-CM

## 2024-06-25 LAB — CMP14+EGFR
ALT: 20 IU/L (ref 0–44)
AST: 21 IU/L (ref 0–40)
Albumin: 4.7 g/dL (ref 4.3–5.2)
Alkaline Phosphatase: 53 IU/L (ref 47–123)
BUN/Creatinine Ratio: 14 (ref 9–20)
BUN: 14 mg/dL (ref 6–20)
Bilirubin Total: 0.7 mg/dL (ref 0.0–1.2)
CO2: 21 mmol/L (ref 20–29)
Calcium: 9.4 mg/dL (ref 8.7–10.2)
Chloride: 101 mmol/L (ref 96–106)
Creatinine, Ser: 0.99 mg/dL (ref 0.76–1.27)
Globulin, Total: 2.3 g/dL (ref 1.5–4.5)
Glucose: 92 mg/dL (ref 70–99)
Potassium: 4.3 mmol/L (ref 3.5–5.2)
Sodium: 141 mmol/L (ref 134–144)
Total Protein: 7 g/dL (ref 6.0–8.5)
eGFR: 108 mL/min/1.73 (ref >59)

## 2024-06-25 LAB — VITAMIN D 25 HYDROXY (VIT D DEFICIENCY, FRACTURES): Vit D, 25-Hydroxy: 53.6 ng/mL (ref 30.0–100.0)

## 2024-06-25 LAB — HEMOGLOBIN A1C
Est. average glucose Bld gHb Est-mCnc: 97 mg/dL
Hgb A1c MFr Bld: 5 % (ref 4.8–5.6)

## 2024-06-25 LAB — INSULIN, RANDOM: INSULIN: 15.6 u[IU]/mL (ref 2.6–24.9)

## 2024-06-25 LAB — VITAMIN B12: Vitamin B-12: 483 pg/mL (ref 232–1245)

## 2024-06-28 ENCOUNTER — Encounter (INDEPENDENT_AMBULATORY_CARE_PROVIDER_SITE_OTHER): Payer: Self-pay | Admitting: Family Medicine

## 2024-06-28 DIAGNOSIS — E559 Vitamin D deficiency, unspecified: Secondary | ICD-10-CM

## 2024-06-29 ENCOUNTER — Ambulatory Visit (INDEPENDENT_AMBULATORY_CARE_PROVIDER_SITE_OTHER): Admitting: Family Medicine

## 2024-06-29 MED ORDER — ERGOCALCIFEROL 1.25 MG (50000 UT) PO CAPS: 1.0000 | ORAL_CAPSULE | ORAL | 0 refills | Status: DC

## 2024-06-29 NOTE — Telephone Encounter (Signed)
 Patient was supposed to be seen today.  Is Vitamin D  okay to refill?

## 2024-06-29 NOTE — Telephone Encounter (Signed)
 Good morning!  Patient needs a vitamin D  refill to same pharmacy.  Thanks!

## 2024-07-03 ENCOUNTER — Other Ambulatory Visit: Payer: Self-pay

## 2024-08-05 ENCOUNTER — Ambulatory Visit (INDEPENDENT_AMBULATORY_CARE_PROVIDER_SITE_OTHER): Payer: Self-pay | Admitting: Family Medicine

## 2024-09-01 ENCOUNTER — Other Ambulatory Visit: Payer: Self-pay

## 2024-09-01 ENCOUNTER — Ambulatory Visit (INDEPENDENT_AMBULATORY_CARE_PROVIDER_SITE_OTHER): Payer: Self-pay | Admitting: Family Medicine

## 2024-09-01 ENCOUNTER — Encounter (INDEPENDENT_AMBULATORY_CARE_PROVIDER_SITE_OTHER): Payer: Self-pay | Admitting: Family Medicine

## 2024-09-01 VITALS — BP 134/81 | HR 76 | Temp 97.4°F | Ht 70.0 in | Wt 219.0 lb

## 2024-09-01 DIAGNOSIS — I10 Essential (primary) hypertension: Secondary | ICD-10-CM | POA: Diagnosis not present

## 2024-09-01 DIAGNOSIS — E669 Obesity, unspecified: Secondary | ICD-10-CM

## 2024-09-01 DIAGNOSIS — Z6831 Body mass index (BMI) 31.0-31.9, adult: Secondary | ICD-10-CM

## 2024-09-01 DIAGNOSIS — E559 Vitamin D deficiency, unspecified: Secondary | ICD-10-CM | POA: Diagnosis not present

## 2024-09-01 MED ORDER — ERGOCALCIFEROL 1.25 MG (50000 UT) PO CAPS
1.0000 | ORAL_CAPSULE | ORAL | 0 refills | Status: DC
  Filled 2024-09-01 – 2024-09-14 (×2): qty 12, 84d supply, fill #0

## 2024-09-01 NOTE — Progress Notes (Signed)
 Office: [phone]  /  Fax: [phone]  WEIGHT SUMMARY AND BIOMETRICS  Anthropometric Measurements Height: 5' 10 (1.778 m) Weight: 219 lb (99.3 kg) BMI (Calculated): 31.42 Weight at Last Visit: 219 lb Weight Lost Since Last Visit: 0 Weight Gained Since Last Visit: 0 Starting Weight: 213 lb Total Weight Loss (lbs): 0 lb (0 kg) Peak Weight: 255 lb   Body Composition  Body Fat %: 26.9 % Fat Mass (lbs): 59.2 lbs Muscle Mass (lbs): 152.6 lbs Total Body Water (lbs): 115.4 lbs Visceral Fat Rating : 9   Other Clinical Data Fasting: no Labs: no Today's Visit #: 8 Starting Date: 12/31/23    Chief Complaint: OBESITY   History of Present Illness Glen Sullivan is a 23 year old male with obesity and hypertension who presents for obesity treatment plan assessment and progress evaluation.  He has been prescribed a low-carb eating plan but has only been able to follow it ten percent of the time. Despite this, he has maintained his weight over the last three months. He goes to the gym three days a week for thirty minutes, working with a systems analyst, focusing mostly on strength training with some stretching.  He has hypertension, which is being managed with lifestyle modifications, including decreasing sodium and carbohydrates, increasing exercise, and weight loss. He is currently taking losartan  50 mg daily. He mentioned that he had stopped taking his medication for a while but resumed last week.  He is also being treated for vitamin D  deficiency with prescription ergocalciferol  50,000 IU per week. His most recent vitamin D  level improved to 53.6 in September. His B12 level has also improved from 424 to 483.  He is self-employed, working in dispensing optician, and has two employees. His family consists of his mother and two siblings, who are supportive but neutral regarding his dietary changes. He notes that his mother plans to make tamales and buuelos for the  holidays, which he considers a temptation.  He feels more hungry at times and is working on meal prepping. He acknowledges a tendency to indulge in sweets, which he identifies as a challenge in his dietary management.      PHYSICAL EXAM:  Blood pressure 134/81, pulse 76, temperature (!) 97.4 F (36.3 C), height 5' 10 (1.778 m), weight 219 lb (99.3 kg), SpO2 98%. Body mass index is 31.42 kg/m.  DIAGNOSTIC DATA REVIEWED:  BMET    Component Value Date/Time   NA 141 06/24/2024 0746   K 4.3 06/24/2024 0746   CL 101 06/24/2024 0746   CO2 21 06/24/2024 0746   GLUCOSE 92 06/24/2024 0746   GLUCOSE 162 (H) 06/26/2022 0453   BUN 14 06/24/2024 0746   CREATININE 0.99 06/24/2024 0746   CALCIUM 9.4 06/24/2024 0746   GFRNONAA >60 06/26/2022 0453   Lab Results  Component Value Date   HGBA1C 5.0 06/24/2024   HGBA1C 5.0 12/31/2023   Lab Results  Component Value Date   INSULIN  15.6 06/24/2024   INSULIN  6.5 12/31/2023   Lab Results  Component Value Date   TSH 1.600 12/31/2023   CBC    Component Value Date/Time   WBC 4.1 12/31/2023 0833   WBC 12.7 (H) 06/26/2022 0453   RBC 4.78 12/31/2023 0833   RBC 4.57 06/26/2022 0453   HGB 14.7 12/31/2023 0833   HCT 43.0 12/31/2023 0833   PLT 151 12/31/2023 0833   MCV 90 12/31/2023 0833   MCH 30.8 12/31/2023 0833   MCH 30.4 06/26/2022 0453   MCHC  34.2 12/31/2023 0833   MCHC 34.8 06/26/2022 0453   RDW 12.5 12/31/2023 0833   Iron Studies No results found for: IRON, TIBC, FERRITIN, IRONPCTSAT Lipid Panel     Component Value Date/Time   CHOL 146 12/31/2023 0833   TRIG 75 12/31/2023 0833   HDL 47 12/31/2023 0833   LDLCALC 84 12/31/2023 0833   Hepatic Function Panel     Component Value Date/Time   PROT 7.0 06/24/2024 0746   ALBUMIN 4.7 06/24/2024 0746   AST 21 06/24/2024 0746   ALT 20 06/24/2024 0746   ALKPHOS 53 06/24/2024 0746   BILITOT 0.7 06/24/2024 0746      Component Value Date/Time   TSH 1.600 12/31/2023 0833    Nutritional Lab Results  Component Value Date   VD25OH 53.6 06/24/2024   VD25OH 31.9 12/31/2023     Assessment and Plan Assessment & Plan Obesity Obesity with BMI 31.0-31.9. He has been prescribed a low-carb eating plan but has only adhered to it 10% of the time. He attends the gym three days a week for 30 minutes with a personal trainer, focusing on strength training and stretching. He has maintained his weight over the last three months. He experiences increased hunger and struggles with carbohydrate intake, particularly sweets. He is working on meal prepping and has been advised to increase protein intake to manage hunger and reduce carbohydrate consumption. - Continue low-carb eating plan with increased adherence. - Continue gym workouts three days a week focusing on strength training and stretching. - Increase protein intake to manage hunger and reduce carbohydrate consumption. - Consider protein snacks such as light Babybel cheeses, beef and turkey jerky, and dry roasted edamame. - Prepare low-carb protein sweet using sugar-free pudding mix, high protein Fairlife milk, and cottage cheese.  Essential hypertension Hypertension managed with lifestyle modifications including decreased sodium and carbohydrate intake, increased exercise, and weight loss. He is on losartan  50 mg daily and has been inconsistent with medication adherence. Blood pressure is currently controlled at 134/81 mmHg. - Continue losartan  50 mg daily. - Encouraged consistent medication adherence by associating it with a daily routine activity. - Continue lifestyle modifications including decreased sodium and carbohydrate intake, increased exercise, and weight loss.  Vitamin D  deficiency Managed with prescription ergocalciferol  50,000 IU weekly. His most recent vitamin D  level improved to 53.6 ng/mL in September 2025. He is advised to continue supplementation to prevent levels from dropping, especially during winter  months. - Continue ergocalciferol  50,000 IU weekly. - Provided a 90-day prescription for vitamin D  to ensure continuity of treatment.      Patients who are on anti-obesity medications are counseled on the importance of maintaining healthy lifestyle habits, including balanced nutrition, regular physical activity, and behavioral modifications,  Medication is an adjunct to, not a replacement for, lifestyle changes and that the long-term success and weight maintenance depend on continued adherence to these strategies.   Dossie was informed of the importance of frequent follow up visits to maximize his success with intensive lifestyle modifications for his obesity and obesity related health conditions as recommended by USPSTF and CMS guidelines  [name], MD

## 2024-09-02 ENCOUNTER — Encounter: Payer: Self-pay | Admitting: Pharmacist

## 2024-09-02 ENCOUNTER — Other Ambulatory Visit: Payer: Self-pay

## 2024-09-02 NOTE — Addendum Note (Signed)
 Addended by: LAFE BAKER CROME on: 09/02/2024 04:12 PM   Modules accepted: Orders

## 2024-09-07 ENCOUNTER — Other Ambulatory Visit: Payer: Self-pay

## 2024-09-08 ENCOUNTER — Other Ambulatory Visit: Payer: Self-pay

## 2024-09-08 ENCOUNTER — Other Ambulatory Visit (HOSPITAL_COMMUNITY): Payer: Self-pay

## 2024-09-08 ENCOUNTER — Encounter: Payer: Self-pay | Admitting: Pharmacist

## 2024-09-08 MED ORDER — LOSARTAN POTASSIUM 50 MG PO TABS
50.0000 mg | ORAL_TABLET | Freq: Every day | ORAL | 1 refills | Status: AC
  Filled 2024-09-08 – 2024-09-14 (×3): qty 90, 90d supply, fill #0

## 2024-09-11 ENCOUNTER — Other Ambulatory Visit: Payer: Self-pay

## 2024-09-14 ENCOUNTER — Other Ambulatory Visit (HOSPITAL_COMMUNITY): Payer: Self-pay

## 2024-09-14 ENCOUNTER — Other Ambulatory Visit: Payer: Self-pay

## 2024-10-20 ENCOUNTER — Ambulatory Visit (INDEPENDENT_AMBULATORY_CARE_PROVIDER_SITE_OTHER): Admitting: Family Medicine

## 2024-10-20 ENCOUNTER — Encounter (INDEPENDENT_AMBULATORY_CARE_PROVIDER_SITE_OTHER): Payer: Self-pay

## 2024-10-20 ENCOUNTER — Other Ambulatory Visit (HOSPITAL_COMMUNITY): Payer: Self-pay

## 2024-10-20 ENCOUNTER — Other Ambulatory Visit: Payer: Self-pay

## 2024-10-20 ENCOUNTER — Encounter (INDEPENDENT_AMBULATORY_CARE_PROVIDER_SITE_OTHER): Payer: Self-pay | Admitting: Family Medicine

## 2024-10-20 VITALS — BP 138/77 | HR 65 | Temp 97.5°F | Ht 70.0 in | Wt 213.0 lb

## 2024-10-20 DIAGNOSIS — E88819 Insulin resistance, unspecified: Secondary | ICD-10-CM | POA: Diagnosis not present

## 2024-10-20 DIAGNOSIS — Z6830 Body mass index (BMI) 30.0-30.9, adult: Secondary | ICD-10-CM | POA: Diagnosis not present

## 2024-10-20 DIAGNOSIS — I10 Essential (primary) hypertension: Secondary | ICD-10-CM

## 2024-10-20 DIAGNOSIS — E669 Obesity, unspecified: Secondary | ICD-10-CM

## 2024-10-20 DIAGNOSIS — E559 Vitamin D deficiency, unspecified: Secondary | ICD-10-CM | POA: Diagnosis not present

## 2024-10-20 MED ORDER — ERGOCALCIFEROL 1.25 MG (50000 UT) PO CAPS
1.0000 | ORAL_CAPSULE | ORAL | 0 refills | Status: AC
  Filled 2024-10-20: qty 12, 84d supply, fill #0

## 2024-10-20 NOTE — Progress Notes (Signed)
 "  Office: [phone]  /  Fax: [phone]  WEIGHT SUMMARY AND BIOMETRICS  Anthropometric Measurements Height: 5' 10 (1.778 m) Weight: 213 lb (96.6 kg) BMI (Calculated): 30.56 Weight at Last Visit: 219 lb Weight Lost Since Last Visit: 6 lb Weight Gained Since Last Visit: 0 Starting Weight: 213 lb Total Weight Loss (lbs): 0 lb (0 kg) Peak Weight: 255 lb   Body Composition  Body Fat %: 26.8 % Fat Mass (lbs): 57 lbs Muscle Mass (lbs): 148.2 lbs Total Body Water (lbs): 109 lbs Visceral Fat Rating : 9   Other Clinical Data Fasting: yes Labs: no Today's Visit #: 9 Starting Date: 12/31/23    Chief Complaint: OBESITY   History of Present Illness Glen Sullivan is a 24 year old male with obesity, hypertension, and insulin  resistance who presents for obesity treatment and progress assessment.  He has been prescribed a low carb eating plan but follows it about 25% of the time. He is working on portion control, chartered loss adjuster, and trying to eat more lean protein, fruits, and vegetables. He focuses on adequate hydration and not skipping meals. He exercises regularly, engaging in activities such as swimming, working with a systems analyst, and dancing for one to two hours, five days a week. He has lost six pounds in the last six weeks.  He has hypertension and is currently taking losartan  50 mg. He also has a vitamin D  deficiency and is on prescription ergocalciferol  50,000 IU per week, for which he requests a refill. He is due to have labs checked today.  He has insulin  resistance. He is due to have his labs checked today.  No significant changes in overall well-being. No pain. His sleep is good, and he is not missing meals. He describes his hunger as 'still there.'      PHYSICAL EXAM:  Blood pressure 138/77, pulse 65, temperature (!) 97.5 F (36.4 C), height 5' 10 (1.778 m), weight 213 lb (96.6 kg), SpO2 98%. Body mass index is 30.56 kg/m.  DIAGNOSTIC  DATA REVIEWED BY MYSELF TODAY:  BMET    Component Value Date/Time   NA 141 06/24/2024 0746   K 4.3 06/24/2024 0746   CL 101 06/24/2024 0746   CO2 21 06/24/2024 0746   GLUCOSE 92 06/24/2024 0746   GLUCOSE 162 (H) 06/26/2022 0453   BUN 14 06/24/2024 0746   CREATININE 0.99 06/24/2024 0746   CALCIUM 9.4 06/24/2024 0746   GFRNONAA >60 06/26/2022 0453   Lab Results  Component Value Date   HGBA1C 5.0 06/24/2024   HGBA1C 5.0 12/31/2023   Lab Results  Component Value Date   INSULIN  15.6 06/24/2024   INSULIN  6.5 12/31/2023   Lab Results  Component Value Date   TSH 1.600 12/31/2023   CBC    Component Value Date/Time   WBC 4.1 12/31/2023 0833   WBC 12.7 (H) 06/26/2022 0453   RBC 4.78 12/31/2023 0833   RBC 4.57 06/26/2022 0453   HGB 14.7 12/31/2023 0833   HCT 43.0 12/31/2023 0833   PLT 151 12/31/2023 0833   MCV 90 12/31/2023 0833   MCH 30.8 12/31/2023 0833   MCH 30.4 06/26/2022 0453   MCHC 34.2 12/31/2023 0833   MCHC 34.8 06/26/2022 0453   RDW 12.5 12/31/2023 0833   Iron Studies No results found for: IRON, TIBC, FERRITIN, IRONPCTSAT Lipid Panel     Component Value Date/Time   CHOL 146 12/31/2023 0833   TRIG 75 12/31/2023 0833   HDL 47 12/31/2023 0833  LDLCALC 84 12/31/2023 0833   Hepatic Function Panel     Component Value Date/Time   PROT 7.0 06/24/2024 0746   ALBUMIN 4.7 06/24/2024 0746   AST 21 06/24/2024 0746   ALT 20 06/24/2024 0746   ALKPHOS 53 06/24/2024 0746   BILITOT 0.7 06/24/2024 0746      Component Value Date/Time   TSH 1.600 12/31/2023 9166   Nutritional Lab Results  Component Value Date   VD25OH 53.6 06/24/2024   VD25OH 31.9 12/31/2023     Assessment and Plan Assessment & Plan Generalized obesity Obesity management with a focus on dietary changes and exercise. He has been prescribed a low-carb eating plan but has only adhered to it 25% of the time. He is working on portion control, increasing lean protein, fruits, and  vegetables, and maintaining hydration. He exercises 1-2 hours, 5 days a week, including swimming, personal training, and dancing. He has lost 6 pounds in the last 6 weeks. - Continue low-carb eating plan with focus on portion control and increased lean protein, fruits, and vegetables. - Continue exercise regimen of 1-2 hours, 5 days a week. - Use AI-assisted food logging app to track dietary intake. - Set dietary goals: 1800 calories, 125 grams of protein, 200 grams of carbohydrates, and 100 grams of healthy fats.  Insulin  resistance Managed primarily through diet, exercise, and weight loss. A1c is controlled, but fasting insulin  remains elevated. Labs are due to monitor progress. - Ordered labs to check insulin  levels. - Will consider medication if insulin  levels remain elevated to help with blood sugars and reduce hunger.  Primary hypertension Hypertension is well-controlled with losartan  50 mg. Blood pressure today is 138/77 mmHg. - Continue losartan  50 mg daily. - Monitor blood pressure at home weekly.  Vitamin D  deficiency Managed with prescription ergocalciferol  50,000 IU weekly. He requests a refill and is due for labs to check vitamin D  levels. - Refilled ergocalciferol  50,000 IU weekly. - Ordered labs to check vitamin D  levels.      Patients who are on anti-obesity medications are counseled on the importance of maintaining healthy lifestyle habits, including balanced nutrition, regular physical activity, and behavioral modifications,  Medication is an adjunct to, not a replacement for, lifestyle changes and that the long-term success and weight maintenance depend on continued adherence to these strategies.   Dywane was informed of the importance of frequent follow up visits to maximize his success with intensive lifestyle modifications for his obesity and obesity related health conditions as recommended by USPSTF and CMS guidelines  [name], MD   "

## 2024-10-21 LAB — CMP14+EGFR
ALT: 19 IU/L (ref 0–44)
AST: 16 IU/L (ref 0–40)
Albumin: 5 g/dL (ref 4.3–5.2)
Alkaline Phosphatase: 53 IU/L (ref 47–123)
BUN/Creatinine Ratio: 16 (ref 9–20)
BUN: 15 mg/dL (ref 6–20)
Bilirubin Total: 0.8 mg/dL (ref 0.0–1.2)
CO2: 24 mmol/L (ref 20–29)
Calcium: 9.6 mg/dL (ref 8.7–10.2)
Chloride: 101 mmol/L (ref 96–106)
Creatinine, Ser: 0.91 mg/dL (ref 0.76–1.27)
Globulin, Total: 2.2 g/dL (ref 1.5–4.5)
Glucose: 94 mg/dL (ref 70–99)
Potassium: 4.3 mmol/L (ref 3.5–5.2)
Sodium: 140 mmol/L (ref 134–144)
Total Protein: 7.2 g/dL (ref 6.0–8.5)
eGFR: 120 mL/min/1.73

## 2024-10-21 LAB — HEMOGLOBIN A1C
Est. average glucose Bld gHb Est-mCnc: 94 mg/dL
Hgb A1c MFr Bld: 4.9 % (ref 4.8–5.6)

## 2024-10-21 LAB — VITAMIN D 25 HYDROXY (VIT D DEFICIENCY, FRACTURES): Vit D, 25-Hydroxy: 44.5 ng/mL (ref 30.0–100.0)

## 2024-10-21 LAB — INSULIN, RANDOM: INSULIN: 15 u[IU]/mL (ref 2.6–24.9)

## 2024-11-23 ENCOUNTER — Ambulatory Visit (INDEPENDENT_AMBULATORY_CARE_PROVIDER_SITE_OTHER): Admitting: Family Medicine
